# Patient Record
Sex: Male | Born: 1995 | Race: Black or African American | Hispanic: No | Marital: Single | Smoking: Never smoker
Health system: Southern US, Community
[De-identification: ages and names within clinical notes are randomized; demographics above are authoritative.]

## PROBLEM LIST (undated history)

## (undated) DIAGNOSIS — E669 Obesity, unspecified: Secondary | ICD-10-CM

## (undated) HISTORY — DX: Obesity, unspecified: E66.9

---
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
## 2016-06-25 ENCOUNTER — Emergency Department (HOSPITAL_COMMUNITY)
Admission: EM | Admit: 2016-06-25 | Discharge: 2016-06-26 | Disposition: A | Discharge status: Home / Self Care | Payer: Medicaid Other | Attending: Emergency Medicine | Admitting: Emergency Medicine

## 2016-06-25 ENCOUNTER — Other Ambulatory Visit: Payer: Self-pay

## 2016-06-25 ENCOUNTER — Encounter (HOSPITAL_COMMUNITY): Payer: Self-pay

## 2016-06-25 DIAGNOSIS — Y999 Unspecified external cause status: Secondary | ICD-10-CM | POA: Insufficient documentation

## 2016-06-25 DIAGNOSIS — K089 Disorder of teeth and supporting structures, unspecified: Secondary | ICD-10-CM | POA: Insufficient documentation

## 2016-06-25 DIAGNOSIS — R55 Syncope and collapse: Secondary | ICD-10-CM | POA: Diagnosis not present

## 2016-06-25 DIAGNOSIS — S0993XA Unspecified injury of face, initial encounter: Secondary | ICD-10-CM | POA: Diagnosis present

## 2016-06-25 DIAGNOSIS — Y929 Unspecified place or not applicable: Secondary | ICD-10-CM | POA: Insufficient documentation

## 2016-06-25 DIAGNOSIS — E860 Dehydration: Secondary | ICD-10-CM

## 2016-06-25 DIAGNOSIS — S0181XA Laceration without foreign body of other part of head, initial encounter: Secondary | ICD-10-CM | POA: Diagnosis not present

## 2016-06-25 DIAGNOSIS — Y939 Activity, unspecified: Secondary | ICD-10-CM | POA: Diagnosis not present

## 2016-06-25 DIAGNOSIS — W19XXXA Unspecified fall, initial encounter: Secondary | ICD-10-CM | POA: Insufficient documentation

## 2016-06-25 LAB — CBC WITH DIFFERENTIAL/PLATELET
BASOS ABS: 0 10*3/uL (ref 0.0–0.1)
BASOS PCT: 0 %
EOS ABS: 0.1 10*3/uL (ref 0.0–0.7)
Eosinophils Relative: 2 %
HCT: 46.7 % (ref 39.0–52.0)
HEMOGLOBIN: 15.8 g/dL (ref 13.0–17.0)
Lymphocytes Relative: 21 %
Lymphs Abs: 1.4 10*3/uL (ref 0.7–4.0)
MCH: 31 pg (ref 26.0–34.0)
MCHC: 33.8 g/dL (ref 30.0–36.0)
MCV: 91.6 fL (ref 78.0–100.0)
MONOS PCT: 10 %
Monocytes Absolute: 0.6 10*3/uL (ref 0.1–1.0)
NEUTROS PCT: 67 %
Neutro Abs: 4.4 10*3/uL (ref 1.7–7.7)
Platelets: 207 10*3/uL (ref 150–400)
RBC: 5.1 MIL/uL (ref 4.22–5.81)
RDW: 12.7 % (ref 11.5–15.5)
WBC: 6.6 10*3/uL (ref 4.0–10.5)

## 2016-06-25 LAB — BASIC METABOLIC PANEL
ANION GAP: 9 (ref 5–15)
BUN: 9 mg/dL (ref 6–20)
CHLORIDE: 106 mmol/L (ref 101–111)
CO2: 24 mmol/L (ref 22–32)
CREATININE: 1.31 mg/dL — AB (ref 0.61–1.24)
Calcium: 8.7 mg/dL — ABNORMAL LOW (ref 8.9–10.3)
GFR calc Af Amer: >60 mL/min (ref >60)
GFR calc non Af Amer: >60 mL/min (ref >60)
Glucose, Bld: 91 mg/dL (ref 65–99)
Potassium: 3.9 mmol/L (ref 3.5–5.1)
SODIUM: 139 mmol/L (ref 135–145)

## 2016-06-25 MED ORDER — SODIUM CHLORIDE 0.9 % IV SOLN
Freq: Once | INTRAVENOUS | Status: AC
  Administered 2016-06-25: 23:00:00 via INTRAVENOUS

## 2016-06-25 MED ORDER — SODIUM CHLORIDE 0.9 % IV BOLUS (SEPSIS)
1000.0000 mL | Freq: Once | INTRAVENOUS | Status: AC
Start: 2016-06-25 — End: 2016-06-25
  Administered 2016-06-25: 1000 mL via INTRAVENOUS

## 2016-06-25 NOTE — ED Notes (Signed)
Patient given sprite, tolerating well.

## 2016-06-25 NOTE — ED Triage Notes (Signed)
Per EMS, pt at bus stop and was witnessed passing out, pt fell to knees and fell face forward and hit chin on concrete. Pt has deep laceration on chin, bleeding controlled. Pt had another syncopal episode with medic on scene with position change from supine to sitting up. Palpated BP was 80 systolic during episode. Pt gave plasma earlier today. Pt has had multiple syncopal episodes in the past but has never been seen for it. CBG 103, BP 130/72 after 500 ml bolus of NS. RR 16. Spo2 99% on RA. HR 88 NSR. Pt denies allergies, medical hx or taking medication.

## 2016-06-26 ENCOUNTER — Encounter (HOSPITAL_COMMUNITY): Payer: Self-pay | Admitting: Radiology

## 2016-06-26 ENCOUNTER — Emergency Department (HOSPITAL_COMMUNITY): Payer: Medicaid Other

## 2016-06-26 MED ORDER — HYDROCODONE-ACETAMINOPHEN 5-325 MG PO TABS: 2.0000 | ORAL_TABLET | ORAL | 0 refills | Status: DC | PRN

## 2016-06-26 MED ORDER — AMOXICILLIN 500 MG PO CAPS: 500.0000 mg | ORAL_CAPSULE | Freq: Three times a day (TID) | ORAL | 0 refills | Status: DC

## 2016-06-26 MED ORDER — SODIUM CHLORIDE 0.9 % IV BOLUS (SEPSIS)
1000.0000 mL | Freq: Once | INTRAVENOUS | Status: AC
  Administered 2016-06-26: 1000 mL via INTRAVENOUS

## 2016-06-26 MED ORDER — HYDROCODONE-ACETAMINOPHEN 5-325 MG PO TABS
2.0000 | ORAL_TABLET | Freq: Once | ORAL | Status: AC
  Administered 2016-06-26: 2 via ORAL
  Filled 2016-06-26: qty 2

## 2016-06-26 NOTE — ED Notes (Signed)
Pt verbalized understanding of d/c instructions and has no further questions. Pt stable and NAD. Pt ambulated down to the end of the hall and back independently without dizziness.

## 2016-06-26 NOTE — ED Notes (Signed)
Pt called out saying his "teeth were falling out". Entering the room the Pt is seen holding broken pieces of his tooth in his hand. Placed the chipped teeth in a container for the Pt.

## 2016-06-26 NOTE — ED Notes (Signed)
PA-C applied dermabond to lac under patient's chin.

## 2016-06-26 NOTE — ED Notes (Signed)
Provided patient with turkey sandwich and sprite. 

## 2016-06-26 NOTE — ED Provider Notes (Signed)
[facility] DEPT Provider Note   CSN: [id number] Arrival date & time: 06/25/16  2154  First Provider Contact:  First MD Initiated Contact with Patient 06/25/16 2215        History   Chief Complaint Chief Complaint  Patient presents with  . Loss of Consciousness    HPI Jeremy Kennedy is a 20 y.o. male.  The history is provided by the patient. No language interpreter was used.  Loss of Consciousness   This is a new problem. The current episode started less than 1 hour ago. The problem occurs rarely. The problem has been resolved. He lost consciousness for a period of less than one minute. Associated with: Pt gave plasma today. Associated symptoms include light-headedness and weakness. He has tried nothing for the symptoms. His past medical history does not include CAD, CVA, DM, HTN, seizures, TIA or vertigo.    Past Medical History:  Diagnosis Date  . Obesity     Patient Active Problem List   Diagnosis Date Noted  . Vitamin D deficiency 06/13/2015  . BMI (body mass index), pediatric, greater than or equal to 95% for age 34/06/2015  . Elevated blood pressure 06/07/2015    History reviewed. No pertinent surgical history.     Home Medications    Prior to Admission medications   Not on File    Family History Family History  Problem Relation Age of Onset  . Obesity Mother   . Obesity Sister   . Cancer Maternal Grandmother   . Diabetes Maternal Grandfather   . Kidney disease Maternal Grandfather   . Obesity      Social History Social History  Substance Use Topics  . Smoking status: Never Smoker  . Smokeless tobacco: Never Used  . Alcohol use No     Allergies   Review of patient's allergies indicates no known allergies.   Review of Systems Review of Systems  Cardiovascular: Positive for syncope.  Neurological: Positive for weakness and light-headedness.  All other systems reviewed and are negative.    Physical Exam Updated Vital Signs BP 126/56    Pulse 79   Temp 99 F (37.2 C) (Oral)   Resp 20   Ht 5' 11.75" (1.822 m)   SpO2 99%   Physical Exam  Constitutional: He is oriented to person, place, and time. He appears well-developed and well-nourished.  HENT:  Head: Normocephalic.  2cm abrasion/laceration chin  Multiple broken teeth,  Tender bilat jaws.    Eyes: EOM are normal.  Neck: Normal range of motion.  Cardiovascular: Normal rate and regular rhythm.   Pulmonary/Chest: Effort normal.  Abdominal: Soft. Bowel sounds are normal. He exhibits no distension.  Musculoskeletal: Normal range of motion.  Neurological: He is alert and oriented to person, place, and time.  Skin: Skin is warm. Capillary refill takes less than 2 seconds.  Psychiatric: He has a normal mood and affect.  Nursing note and vitals reviewed.    ED Treatments / Results  Labs (all labs ordered are listed, but only abnormal results are displayed) Labs Reviewed  BASIC METABOLIC PANEL - Abnormal; Notable for the following:       Result Value   Creatinine, Ser 1.31 (*)    Calcium 8.7 (*)    All other components within normal limits  CBC WITH DIFFERENTIAL/PLATELET    EKG  EKG Interpretation  Date/Time:  Wednesday June 25 2016 21:22:58 EDT Ventricular Rate:  84 PR Interval:  134 QRS Duration: 98 QT Interval:  374 QTC Calculation:  441 R Axis:   64 Text Interpretation:  Normal sinus rhythm Normal ECG No previous ECGs available Confirmed by Bebe Shaggy  MD, DONALD ([id number]) on 06/25/2016 9:55:45 PM       Radiology Ct Head Wo Contrast  Result Date: 06/26/2016 CLINICAL DATA:  Syncopal episode, now with bruising involving the chin. EXAM: CT HEAD WITHOUT CONTRAST CT MAXILLOFACIAL WITHOUT CONTRAST TECHNIQUE: Multidetector CT imaging of the head and maxillofacial structures were performed using the standard protocol without intravenous contrast. Multiplanar CT image reconstructions of the maxillofacial structures were also generated. COMPARISON:  None.  FINDINGS: CT HEAD FINDINGS Soft tissues are normal. No radiopaque foreign body. No displaced calvarial fracture. Gray-white differentiation is maintained. No CT evidence of acute large territory infarct. No intraparenchymal or extra-axial mass or hemorrhage. Normal size and configuration of the ventricles and the basilar cisterns. No midline shift. There is underpneumatization of the left frontal sinus. The remaining paranasal sinuses mastoid air cells are normally aerated. No air-fluid levels. CT MAXILLOFACIAL FINDINGS No displaced facial fracture. Normal appearance of the nasal bone. Normal appearance of the pterygoid plates. Normal appearance of the zygomatic arches. Normal appearance of the mandible. The bilateral mandibular condyles are normally located. There is reversal of the expected cervical lordosis with mild kyphosis centered about the C5-C6 articulation. No anterolisthesis. Cervical vertebral body heights are preserved. Prevertebral soft tissues are normal. Normal atlantodental and atlantoaxial articulations. Normal noncontrast appearance of the bilateral orbits and globes. No retrobulbar hematoma. Normal appearance of the bilateral lamina papyracea. There is underpneumatization of the left frontal sinus. The remaining paranasal sinuses and mastoid air cells are normally aerated. No air-fluid levels. There is approximately 3 mm of right word nasal septal deviation. Suspected minimal amount of soft tissue stranding about the chin (sagittal image 68, series 10). No radiopaque foreign body. A minimal amount of debris is noted within the bilateral external auditory canals. No bulky cervical lymphadenopathy on this noncontrast examination. Visualized portions of the thyroid gland are normal. IMPRESSION: 1. Negative noncontrast head CT. 2. Suspected minimal amount of soft tissue swelling about the chin without associated radiopaque foreign body or displaced facial fracture. Electronically Signed   By: Simonne Come M.D.   On: 06/26/2016 01:58  Ct Maxillofacial Wo Cm  Result Date: 06/26/2016 CLINICAL DATA:  Syncopal episode, now with bruising involving the chin. EXAM: CT HEAD WITHOUT CONTRAST CT MAXILLOFACIAL WITHOUT CONTRAST TECHNIQUE: Multidetector CT imaging of the head and maxillofacial structures were performed using the standard protocol without intravenous contrast. Multiplanar CT image reconstructions of the maxillofacial structures were also generated. COMPARISON:  None. FINDINGS: CT HEAD FINDINGS Soft tissues are normal. No radiopaque foreign body. No displaced calvarial fracture. Gray-white differentiation is maintained. No CT evidence of acute large territory infarct. No intraparenchymal or extra-axial mass or hemorrhage. Normal size and configuration of the ventricles and the basilar cisterns. No midline shift. There is underpneumatization of the left frontal sinus. The remaining paranasal sinuses mastoid air cells are normally aerated. No air-fluid levels. CT MAXILLOFACIAL FINDINGS No displaced facial fracture. Normal appearance of the nasal bone. Normal appearance of the pterygoid plates. Normal appearance of the zygomatic arches. Normal appearance of the mandible. The bilateral mandibular condyles are normally located. There is reversal of the expected cervical lordosis with mild kyphosis centered about the C5-C6 articulation. No anterolisthesis. Cervical vertebral body heights are preserved. Prevertebral soft tissues are normal. Normal atlantodental and atlantoaxial articulations. Normal noncontrast appearance of the bilateral orbits and globes. No retrobulbar hematoma. Normal appearance of the bilateral  lamina papyracea. There is underpneumatization of the left frontal sinus. The remaining paranasal sinuses and mastoid air cells are normally aerated. No air-fluid levels. There is approximately 3 mm of right word nasal septal deviation. Suspected minimal amount of soft tissue stranding about the chin  (sagittal image 68, series 10). No radiopaque foreign body. A minimal amount of debris is noted within the bilateral external auditory canals. No bulky cervical lymphadenopathy on this noncontrast examination. Visualized portions of the thyroid gland are normal. IMPRESSION: 1. Negative noncontrast head CT. 2. Suspected minimal amount of soft tissue swelling about the chin without associated radiopaque foreign body or displaced facial fracture. Electronically Signed   By: Simonne Come M.D.   On: 06/26/2016 01:58   Procedures .Marland KitchenLaceration Repair Date/Time: 06/26/2016 2:14 AM Performed by: Elson Areas Authorized by: Elson Areas   Consent:    Consent obtained:  Verbal   Consent given by:  Patient   Risks discussed:  Pain   Alternatives discussed:  No treatment Anesthesia (see MAR for exact dosages):    Anesthesia method:  None Laceration details:    Length (cm):  2 Treatment:    Area cleansed with:  Betadine   Amount of cleaning:  Standard   Irrigation solution:  Sterile saline Skin repair:    Repair method:  Tissue adhesive Approximation:    Approximation:  Close Post-procedure details:    Dressing:  Non-adherent dressing   (including critical care time)  Medications Ordered in ED Medications  sodium chloride 0.9 % bolus 1,000 mL (0 mLs Intravenous Stopped 06/25/16 2313)  0.9 %  sodium chloride infusion ( Intravenous Stopped 06/26/16 0008)  sodium chloride 0.9 % bolus 1,000 mL (1,000 mLs Intravenous New Bag/Given 06/26/16 0022)  HYDROcodone-acetaminophen (NORCO/VICODIN) 5-325 MG per tablet 2 tablet (2 tablets Oral Given 06/26/16 0158)     Initial Impression / Assessment and Plan / ED Course  I have reviewed the triage vital signs and the nursing notes.  Pertinent labs & imaging results that were available during my care of the patient were reviewed by me and considered in my medical decision making (see chart for details).  Clinical Course  Value Comment By Time  CT HEAD  WO CONTRAST (Reviewed) Elson Areas, PA-C 07/27 0206    Pt given iv fluids x 2 liters.   Pt advised he needs to see dentist for broken teeth.  Referral to Dr. Lucky Cowboy.  RX for amoxicillian and Hydrocodone for pain.   Pt has had multiple syncopal episodes after donating plasma.  Pt advised to rest and drink plenty of fluids.  Pt advised he may need to refrain from plasma donation.  Final Clinical Impressions(s) / ED Diagnoses   Final diagnoses:  Syncope and collapse  Dehydration  Laceration of skin of chin, initial encounter  Dental injury, initial encounter    New Prescriptions New Prescriptions   AMOXICILLIN (AMOXIL) 500 MG CAPSULE    Take 1 capsule (500 mg total) by mouth 3 (three) times daily.   HYDROCODONE-ACETAMINOPHEN (NORCO/VICODIN) 5-325 MG TABLET    Take 2 tablets by mouth every 4 (four) hours as needed.  An After Visit Summary was printed and given to the patient.   Lonia Skinner Lovelady, PA-C 06/26/16 0225    Zadie Rhine, MD 06/26/16 [phone]

## 2016-06-26 NOTE — ED Notes (Signed)
PA-C at bedside 

## 2016-10-12 ENCOUNTER — Encounter (HOSPITAL_COMMUNITY): Payer: Self-pay

## 2016-10-12 ENCOUNTER — Emergency Department (HOSPITAL_COMMUNITY)
Admission: EM | Admit: 2016-10-12 | Discharge: 2016-10-12 | Disposition: A | Discharge status: Home / Self Care | Payer: Medicaid Other | Attending: Emergency Medicine | Admitting: Emergency Medicine

## 2016-10-12 ENCOUNTER — Emergency Department (HOSPITAL_COMMUNITY): Payer: Medicaid Other

## 2016-10-12 DIAGNOSIS — E860 Dehydration: Secondary | ICD-10-CM | POA: Insufficient documentation

## 2016-10-12 DIAGNOSIS — R739 Hyperglycemia, unspecified: Secondary | ICD-10-CM

## 2016-10-12 DIAGNOSIS — M25572 Pain in left ankle and joints of left foot: Secondary | ICD-10-CM | POA: Diagnosis not present

## 2016-10-12 DIAGNOSIS — N289 Disorder of kidney and ureter, unspecified: Secondary | ICD-10-CM

## 2016-10-12 DIAGNOSIS — R55 Syncope and collapse: Secondary | ICD-10-CM

## 2016-10-12 DIAGNOSIS — R52 Pain, unspecified: Secondary | ICD-10-CM

## 2016-10-12 LAB — I-STAT CHEM 8, ED
BUN: 10 mg/dL (ref 6–20)
Calcium, Ion: 1.12 mmol/L — ABNORMAL LOW (ref 1.15–1.40)
Chloride: 101 mmol/L (ref 101–111)
Creatinine, Ser: 1.3 mg/dL — ABNORMAL HIGH (ref 0.61–1.24)
Glucose, Bld: 122 mg/dL — ABNORMAL HIGH (ref 65–99)
HEMATOCRIT: 44 % (ref 39.0–52.0)
HEMOGLOBIN: 15 g/dL (ref 13.0–17.0)
POTASSIUM: 3.9 mmol/L (ref 3.5–5.1)
SODIUM: 141 mmol/L (ref 135–145)
TCO2: 25 mmol/L (ref 0–100)

## 2016-10-12 MED ORDER — SODIUM CHLORIDE 0.9 % IV BOLUS (SEPSIS)
1000.0000 mL | Freq: Once | INTRAVENOUS | Status: AC
  Administered 2016-10-12: 1000 mL via INTRAVENOUS

## 2016-10-12 NOTE — Discharge Instructions (Signed)
Read the information below.  You may return to the Emergency Department at any time for worsening condition or any new symptoms that concern you.    Please drink plenty of fluids and consider spacing out your plasma donation more in the future.  Please see a family doctor or primary care provider to recheck your blood sugar and your kidney function.

## 2016-10-12 NOTE — ED Provider Notes (Signed)
[facility] Provider Note   CSN: [id number] Arrival date & time: 10/12/16  1824     History   Chief Complaint Chief Complaint  Patient presents with  . Loss of Consciousness    HPI Jeremy Kennedy is a 20 y.o. male.  HPI   Pt with hx morbid obesity p/w syncopal episode and left ankle pain.  States he gave plasma today, also two days ago, was standing up on the city bus on his way home and felt lightheaded followed by syncopal episode.  Larey SeatFell and hurt his left ankle.  Denies any other injury.  Denies CP, SOB, cough, recent vomiting or diarrhea.  Any other sick symptoms.      Past Medical History:  Diagnosis Date  . Obesity     Patient Active Problem List   Diagnosis Date Noted  . Vitamin D deficiency 06/13/2015  . BMI (body mass index), pediatric, greater than or equal to 95% for age 53/06/2015  . Elevated blood pressure 06/07/2015    History reviewed. No pertinent surgical history.     Home Medications    Prior to Admission medications   Medication Sig Start Date End Date Taking? Authorizing Provider  amoxicillin (AMOXIL) 500 MG capsule Take 1 capsule (500 mg total) by mouth 3 (three) times daily. 06/26/16   Elson AreasLeslie K Sofia, PA-C  HYDROcodone-acetaminophen (NORCO/VICODIN) 5-325 MG tablet Take 2 tablets by mouth every 4 (four) hours as needed. 06/26/16   Elson AreasLeslie K Sofia, PA-C    Family History Family History  Problem Relation Age of Onset  . Obesity Mother   . Obesity Sister   . Cancer Maternal Grandmother   . Diabetes Maternal Grandfather   . Kidney disease Maternal Grandfather   . Obesity      Social History Social History  Substance Use Topics  . Smoking status: Never Smoker  . Smokeless tobacco: Never Used  . Alcohol use No     Allergies   Patient has no known allergies.   Review of Systems Review of Systems  All other systems reviewed and are negative.    Physical Exam Updated Vital Signs BP 117/60 (BP Location: Left Arm)   Pulse 81    Temp 98.2 F (36.8 C) (Oral)   Resp 13   SpO2 99%   Physical Exam  Constitutional: He appears well-developed and well-nourished. No distress.  HENT:  Head: Normocephalic and atraumatic.  Neck: Neck supple.  Cardiovascular: Normal rate and regular rhythm.   Pulmonary/Chest: Effort normal and breath sounds normal. No respiratory distress. He has no wheezes. He has no rales.  Abdominal: Soft. He exhibits no distension and no mass. There is no tenderness. There is no rebound and no guarding.  Musculoskeletal:  Left lateral ankle with mild tenderness over malleolus.    Neurological: He is alert. He exhibits normal muscle tone.  Skin: He is not diaphoretic.  Nursing note and vitals reviewed.    ED Treatments / Results  Labs (all labs ordered are listed, but only abnormal results are displayed) Labs Reviewed  I-STAT CHEM 8, ED - Abnormal; Notable for the following:       Result Value   Creatinine, Ser 1.30 (*)    Glucose, Bld 122 (*)    Calcium, Ion 1.12 (*)    All other components within normal limits    EKG  EKG Interpretation  Date/Time:  Sunday October 12 2016 18:29:41 EST Ventricular Rate:  82 PR Interval:    QRS Duration: 103 QT Interval:  373  QTC Calculation: 436 R Axis:   -3 Text Interpretation:  Normal sinus rhythm Normal ECG no significant change since July 2017 Confirmed by Criss AlvineGOLDSTON MD, SCOTT [phone]([id number]) on 10/12/2016 6:34:06 PM       Radiology Dg Ankle Complete Left  Result Date: 10/12/2016 CLINICAL DATA:  Lateral ankle pain status post fall. EXAM: LEFT ANKLE COMPLETE - 3+ VIEW COMPARISON:  None. FINDINGS: There is no evidence of fracture, or joint effusion. The alignment of ankle mortise appears normal on the frontal view, however possible misalignment of the tibial plafond is noted on the lateral view. IMPRESSION: No evidence of fracture. Apparent misalignment of the tibial plafond seen on the lateral view, which may be positional. If patient's symptoms  continue, repeated lateral radiograph may be considered. Electronically Signed   By: Ted Mcalpineobrinka  Dimitrova M.D.   On: 10/12/2016 19:56    Procedures Procedures (including critical care time)  Medications Ordered in ED Medications  sodium chloride 0.9 % bolus 1,000 mL (1,000 mLs Intravenous New Bag/Given 10/12/16 1915)     Initial Impression / Assessment and Plan / ED Course  I have reviewed the triage vital signs and the nursing notes.  Pertinent labs & imaging results that were available during my care of the patient were reviewed by me and considered in my medical decision making (see chart for details).  Clinical Course as of Oct 13 2023  Wynelle LinkSun Oct 12, 2016  2012 Pt is able to ambulate without significant pain and without feeling lightheaded or weak.    [EW]    Clinical Course User Index [EW] Trixie DredgeEmily Gerrad Welker, PA-C   Afebrile, nontoxic patient with syncopal episode after giving plasma for second time in 3 days.  Became tachycardic with standing for orthostatics.  IVF hydration given.  EKG reassuring.  Left ankle injured in fall, able to bear weight easily without significant pain.  ASO placed in ED.  Pt advised to follow up with PCP regarding ankle, hyperglycemia, and renal insufficiency.   D/C home with PCP resources, ASO.   Discussed result, findings, treatment, and follow up  with patient.  Pt given return precautions.  Pt verbalizes understanding and agrees with plan.       Final Clinical Impressions(s) / ED Diagnoses   Final diagnoses:  Pain  Syncope, unspecified syncope type  Dehydration  Acute left ankle pain  Hyperglycemia  Renal insufficiency    New Prescriptions New Prescriptions   No medications on file     Trixie Dredgemily Rosario Kushner, PA-C 10/12/16 2024    Pricilla LovelessScott Goldston, MD 10/12/16 2201

## 2016-10-12 NOTE — Progress Notes (Signed)
Orthopedic Tech Progress Note Patient Details:  Jeremy FiddlerDemoris Kennedy 09/20/1996 [id number]  Ortho Devices Type of Ortho Device: ASO Ortho Device/Splint Location: LLE Ortho Device/Splint Interventions: Ordered, Application   Jennye MoccasinHughes, Toyoko Silos Craig 10/12/2016, 8:28 PM

## 2016-10-12 NOTE — ED Notes (Signed)
Cab called for pt by me. Pt  In cab now

## 2016-10-12 NOTE — ED Triage Notes (Signed)
Per EMS: Pt gave plasma today. Pt on way home, experienced a syncopal episode. Pt denies any head, neck or back pain. Pt complaining of some L ankle pain. CBG = 121 Pt a/o x 4 upon arrival.

## 2016-10-12 NOTE — ED Notes (Signed)
Ortho tech paged  

## 2016-10-12 NOTE — ED Notes (Signed)
Pt eating food he brought with him and drinking sprite. Tolerating well

## 2016-10-13 ENCOUNTER — Other Ambulatory Visit: Payer: Self-pay | Admitting: Pediatrics

## 2018-03-19 IMAGING — CT CT HEAD W/O CM
3 of 7 series · 14 of 47 positions shown, 16 images · non-contrast
Comparison: None.

CLINICAL DATA: Syncopal episode, now with bruising involving the
chin.

EXAM:
CT HEAD WITHOUT CONTRAST
CT MAXILLOFACIAL WITHOUT CONTRAST
TECHNIQUE: Multidetector CT imaging of the head and maxillofacial structures
were performed using the standard protocol without intravenous
contrast. Multiplanar CT image reconstructions of the maxillofacial
structures were also generated.

[Series 6: facialbone 2.0 st · axial · 0.41mm/px · z∈[-268,-112]mm · 8 of 94 slices shown, 10 images]
[im 8/94  brain]
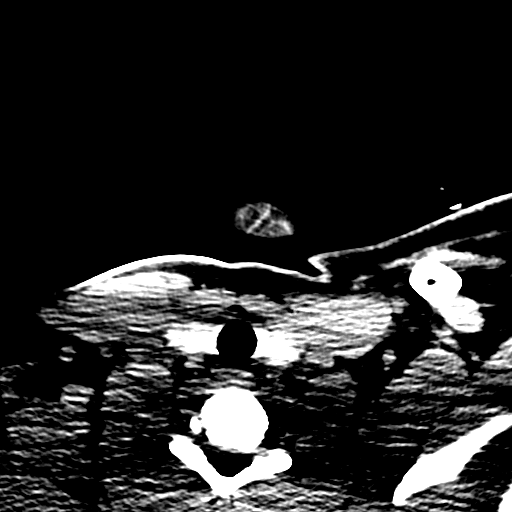
[im 8/94  bone]
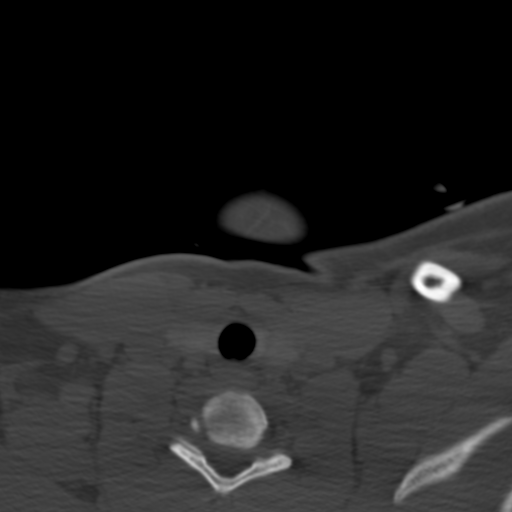
[im 22/94  brain]
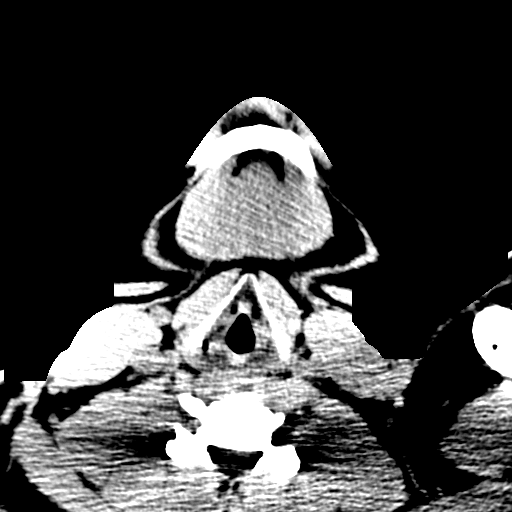
[im 29/94  brain]
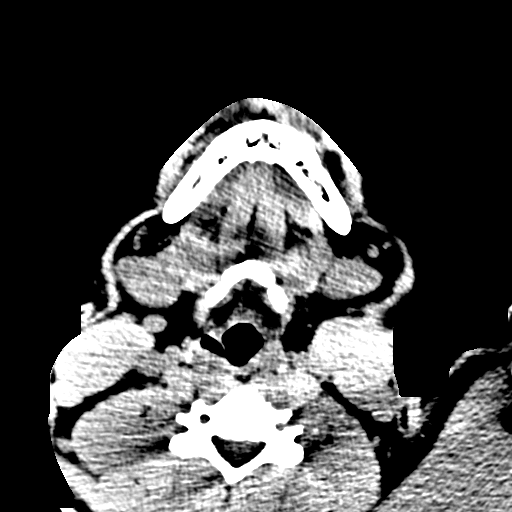
[im 43/94  brain]
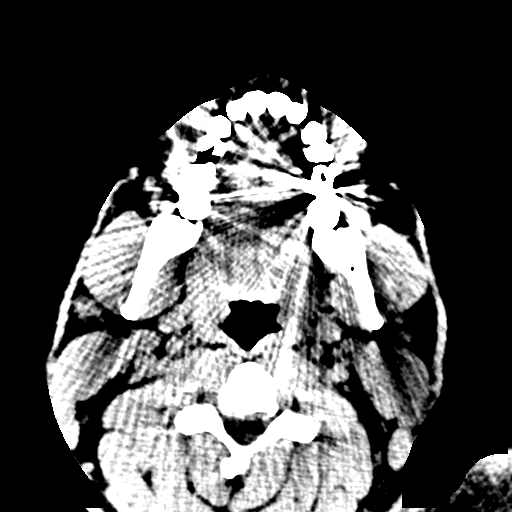
[im 51/94  brain]
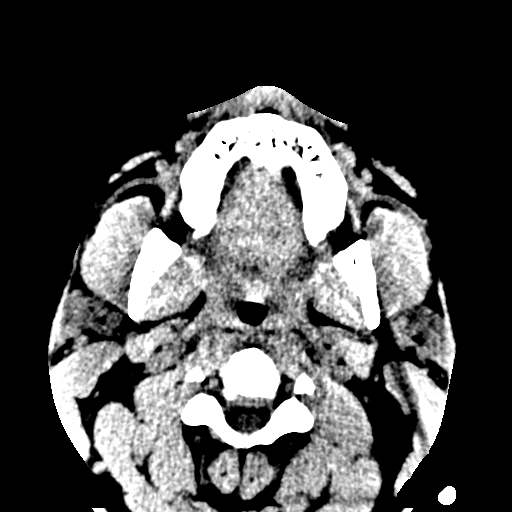
[im 51/94  bone]
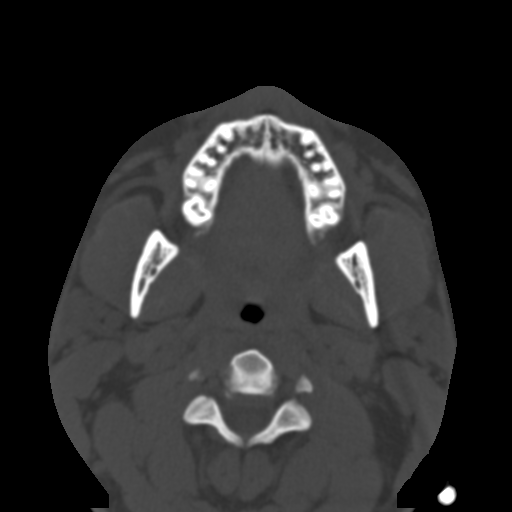
[im 65/94  brain]
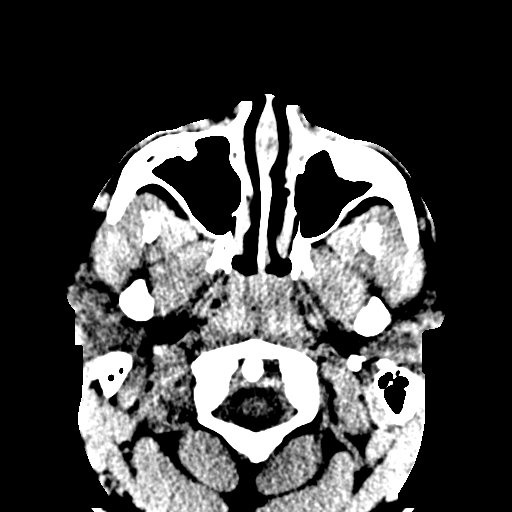
[im 72/94  brain]
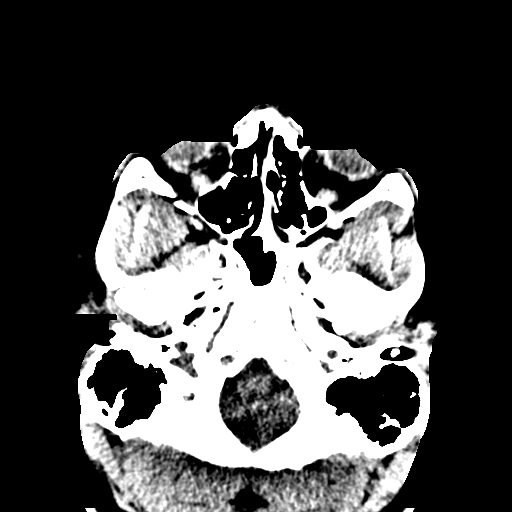
[im 86/94  brain]
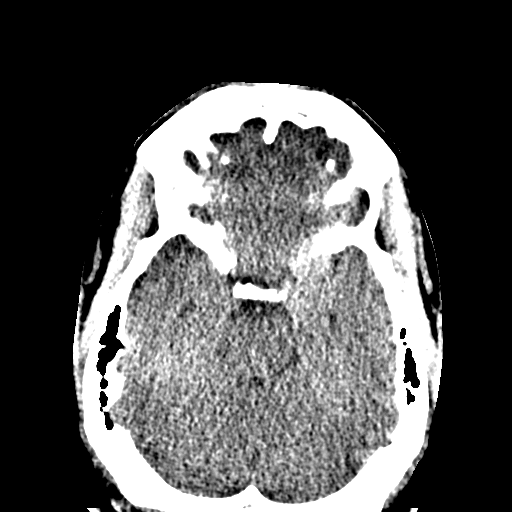

[Series 10: facialbone 2.0 sag st · sagittal · 0.37mm/px · 3 of 123 slices shown]
[im 31/123  brain]
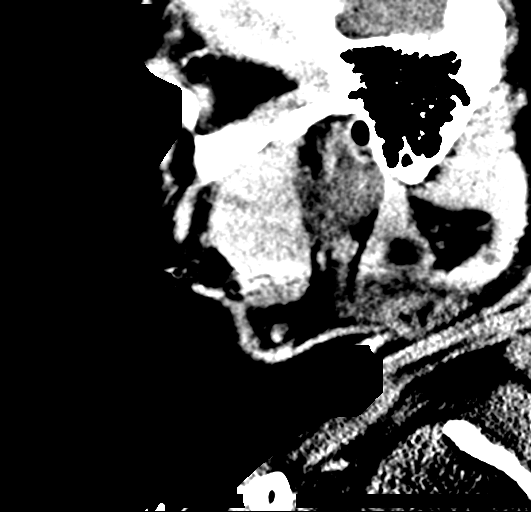
[im 62/123  brain]
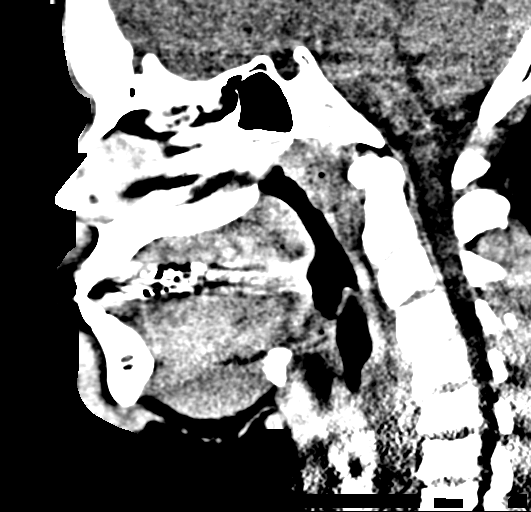
[im 92/123  brain]
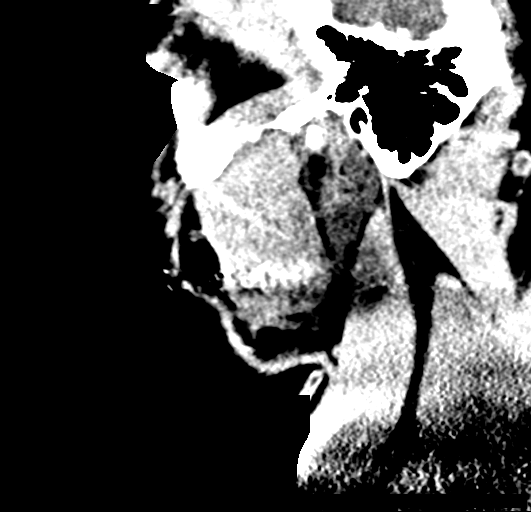

[Series 11: facialbone 2.0 cor st · coronal · 0.37mm/px · 3 of 98 slices shown]
[im 28/98  brain]
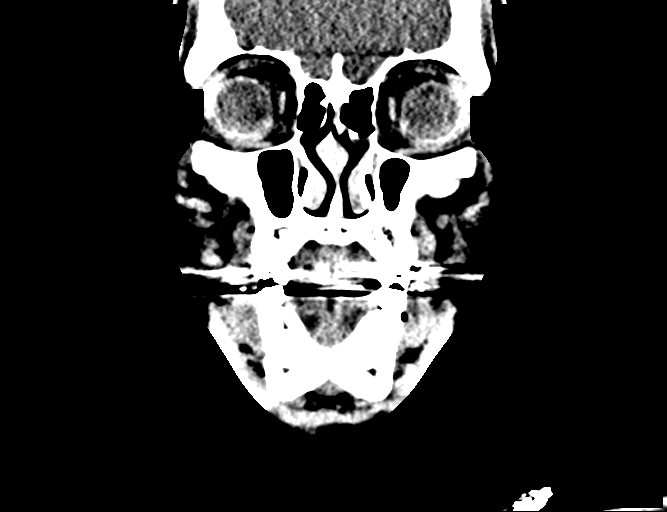
[im 42/98  brain]
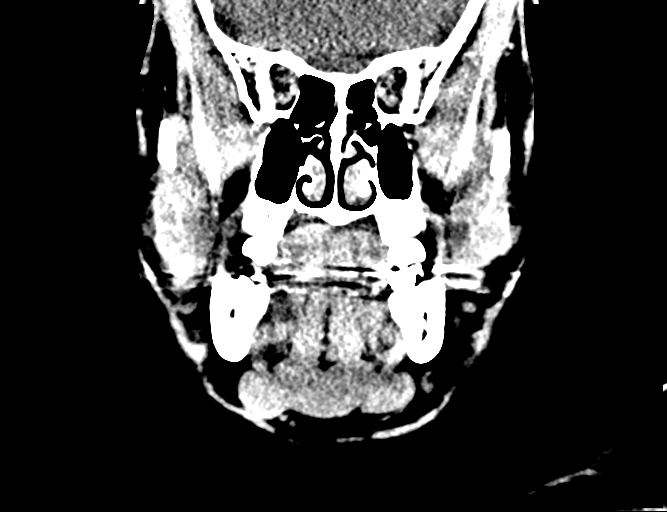
[im 56/98  brain]
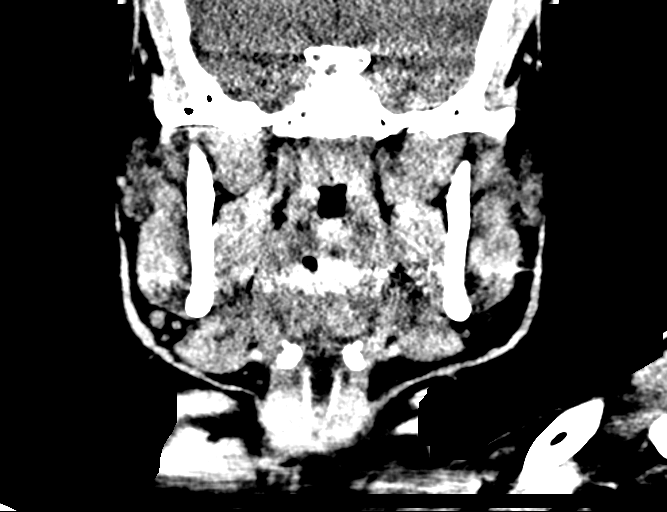

[14 of 47 positions shown; findings below may reference images not displayed]

FINDINGS: CT HEAD FINDINGS

Soft tissues are normal. No radiopaque foreign body. No displaced
calvarial fracture.

Gray-white differentiation is maintained. No CT evidence of acute
large territory infarct. No intraparenchymal or extra-axial mass or
hemorrhage. Normal size and configuration of the ventricles and the
basilar cisterns. No midline shift.

There is underpneumatization of the left frontal sinus. The
remaining paranasal sinuses mastoid air cells are normally aerated.
No air-fluid levels.

CT MAXILLOFACIAL FINDINGS

No displaced facial fracture.

Normal appearance of the nasal bone. Normal appearance of the
pterygoid plates. Normal appearance of the zygomatic arches. Normal
appearance of the mandible. The bilateral mandibular condyles are
normally located.

There is reversal of the expected cervical lordosis with mild
kyphosis centered about the C5-C6 articulation. No anterolisthesis.
Cervical vertebral body heights are preserved. Prevertebral soft
tissues are normal. Normal atlantodental and atlantoaxial
articulations.

Normal noncontrast appearance of the bilateral orbits and globes. No
retrobulbar hematoma. Normal appearance of the bilateral lamina
papyracea.

There is underpneumatization of the left frontal sinus. The
remaining paranasal sinuses and mastoid air cells are normally
aerated. No air-fluid levels. There is approximately 3 mm of right
word nasal septal deviation.

Suspected minimal amount of soft tissue stranding about the chin
(sagittal image 68, series 10). No radiopaque foreign body.

A minimal amount of debris is noted within the bilateral external
auditory canals. No bulky cervical lymphadenopathy on this
noncontrast examination. Visualized portions of the thyroid gland
are normal.
IMPRESSION: 1. Negative noncontrast head CT.
2. Suspected minimal amount of soft tissue swelling about the chin
without associated radiopaque foreign body or displaced facial
fracture.

## 2018-07-05 IMAGING — DX DG ANKLE COMPLETE 3+V*L*
3 series · 3 of 3 positions shown · non-contrast
Comparison: None.

CLINICAL DATA: Lateral ankle pain status post fall.

EXAM:
LEFT ANKLE COMPLETE - 3+ VIEW

[ankle ap]
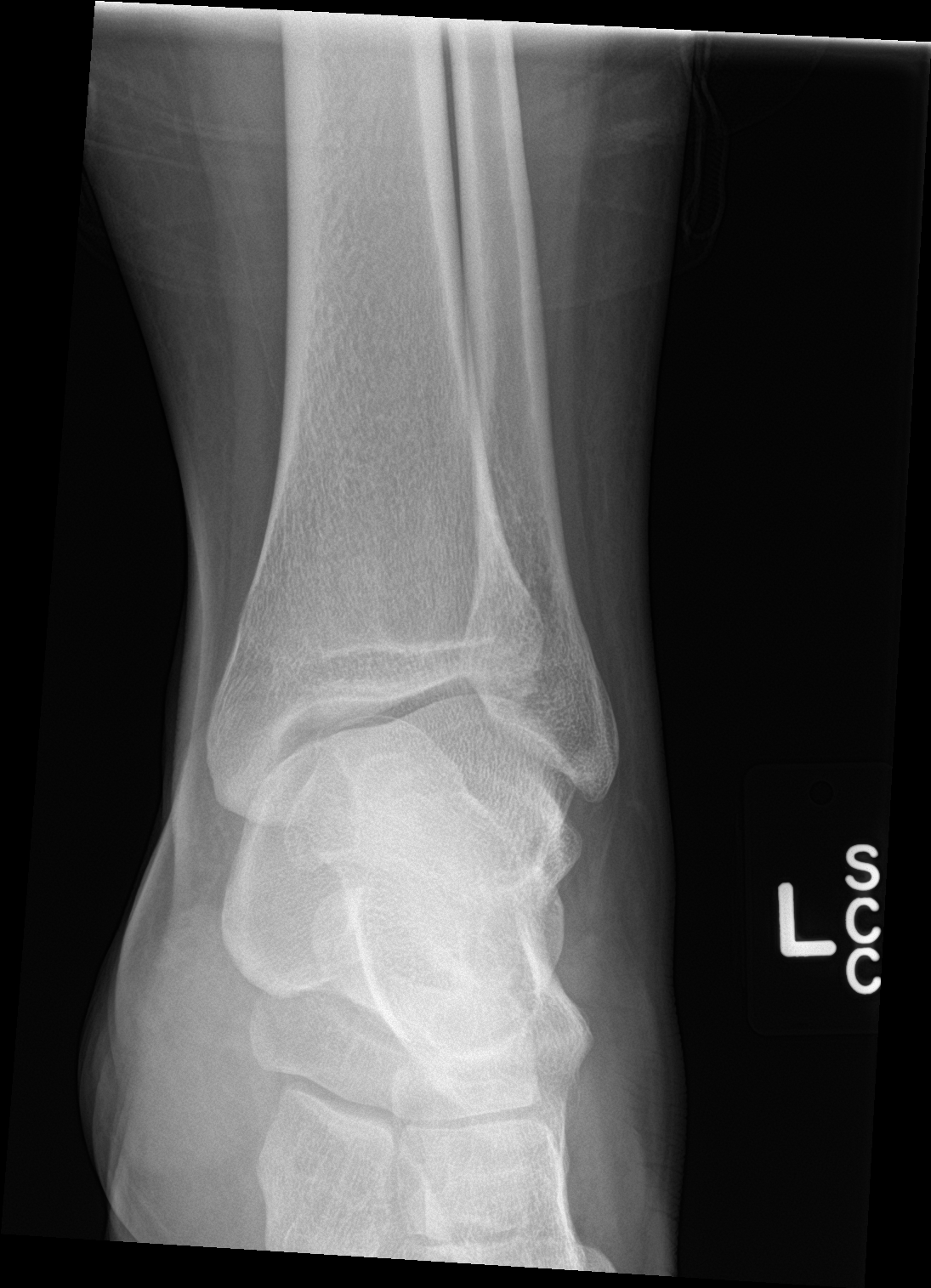

[ankle obl]
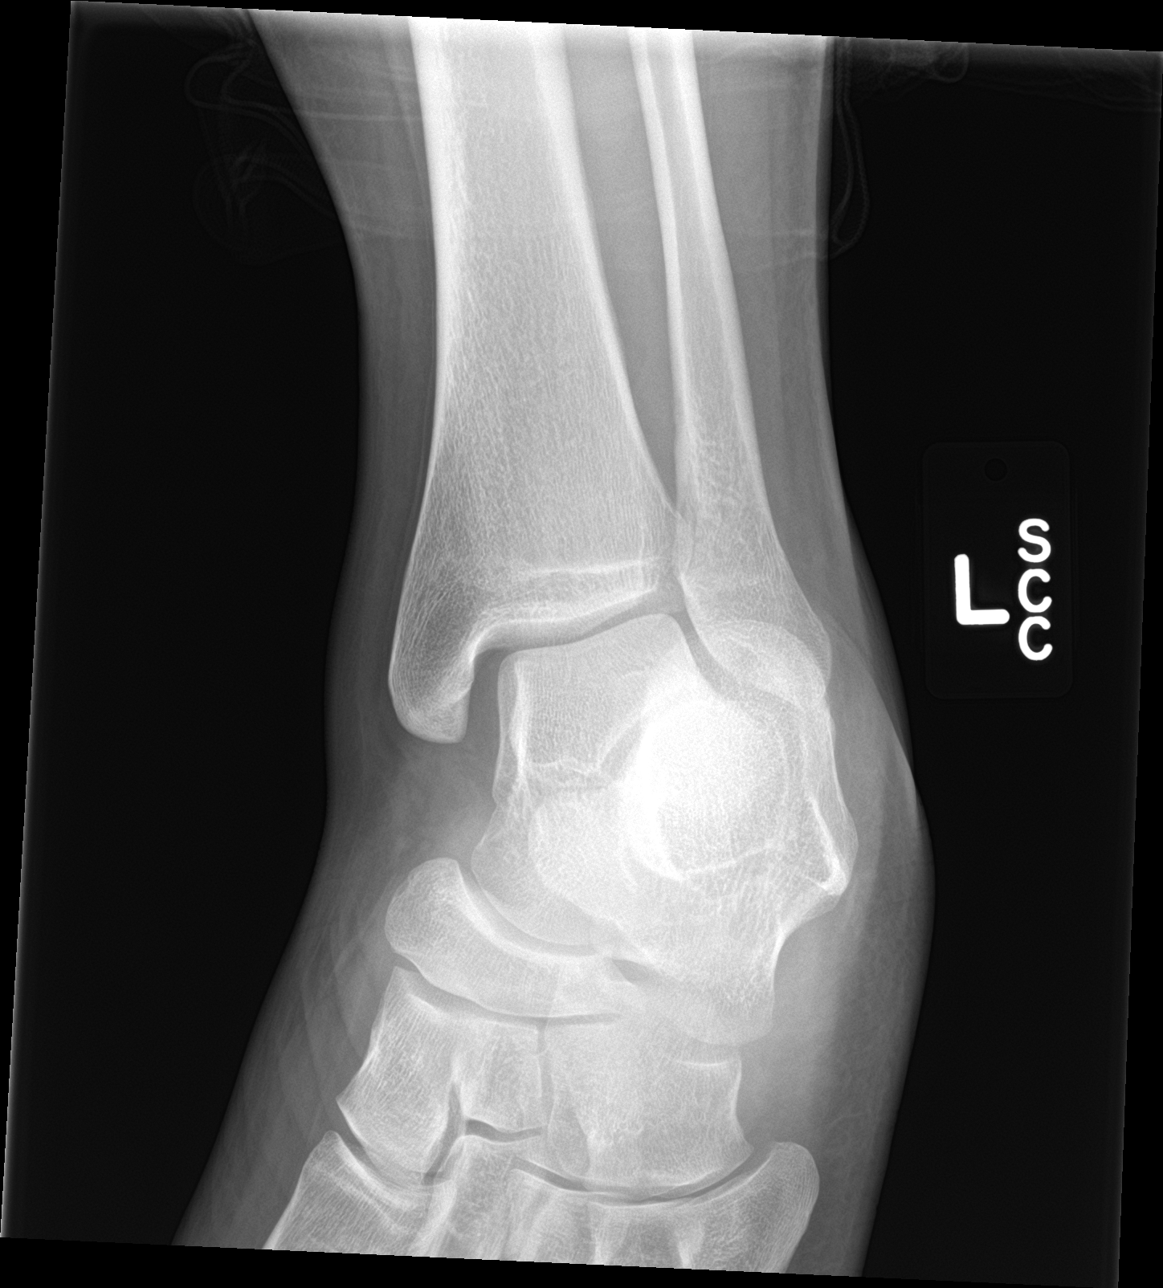

[ankle lat]
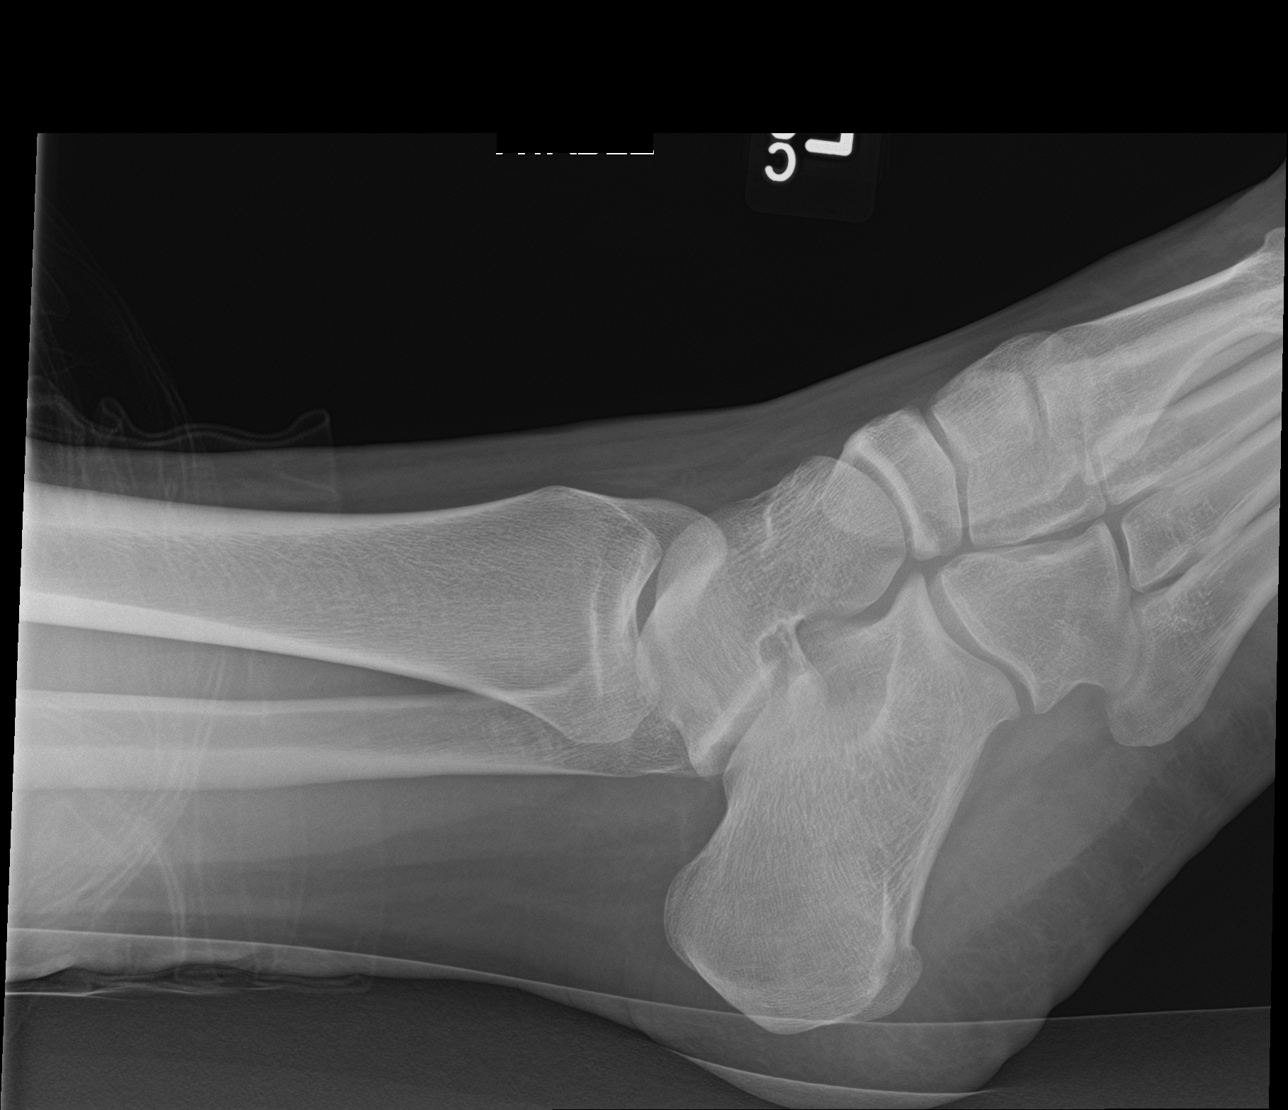

[3 of 3 positions shown; findings below may reference images not displayed]

FINDINGS: There is no evidence of fracture, or joint effusion. The alignment
of ankle mortise appears normal on the frontal view, however
possible misalignment of the tibial plafond is noted on the lateral
view.
IMPRESSION: No evidence of fracture.

Apparent misalignment of the tibial plafond seen on the lateral
view, which may be positional. If patient's symptoms continue,
repeated lateral radiograph may be considered.

## 2020-04-16 ENCOUNTER — Encounter: Payer: Self-pay | Admitting: Pediatrics

## 2022-11-06 DIAGNOSIS — K029 Dental caries, unspecified: Secondary | ICD-10-CM | POA: Diagnosis not present

## 2022-11-07 ENCOUNTER — Encounter: Payer: Self-pay | Admitting: Student

## 2022-11-07 ENCOUNTER — Ambulatory Visit: Payer: Medicaid Other | Admitting: Student

## 2022-11-07 VITALS — BP 122/63 | HR 105 | Ht 71.0 in | Wt >= 6400 oz

## 2022-11-07 DIAGNOSIS — F322 Major depressive disorder, single episode, severe without psychotic features: Secondary | ICD-10-CM | POA: Diagnosis not present

## 2022-11-07 DIAGNOSIS — Z13228 Encounter for screening for other metabolic disorders: Secondary | ICD-10-CM

## 2022-11-07 MED ORDER — ESCITALOPRAM OXALATE 10 MG PO TABS: 10.0000 mg | ORAL_TABLET | Freq: Every day | ORAL | 0 refills | Status: DC

## 2022-11-07 NOTE — Patient Instructions (Addendum)
We will start lexapro 10 mg daily  If you are feeling suicidal or depression symptoms worsen please immediately go to:   If you are thinking about harming yourself or having thoughts of suicide, or if you know someone who is, seek help right away. If you are in crisis, make sure you are not left alone.  If someone else is in crisis, make sure he/she/they is not left alone  Call 988 OR 1-800-273-TALK  24 Hour Availability for Walk-IN services  [facility]  [address] [place], [place] [phone] Crisis [phone]    Other crisis resources:  [facility] [employer] (Domestic Violence, Rape & Victim Assistance [phone]  [facility] [employer] [facility]    (ONLY from 8am-4pm)    [phone]  [facility] (24/7)   [phone]  [place] National Suicide Hotline   [phone] Len Childs)

## 2022-11-07 NOTE — Progress Notes (Unsigned)
   New Patient Office Visit  Subjective    Patient ID: Jeremy Kennedy, male    DOB: [birth year]  Age: 26 y.o. MRN: [id number]  CC:  Chief Complaint  Patient presents with   Establish Care    HPI Jeremy Kennedy presents to establish care  PMH-elevated BMI, elevated BP PSxHx-none Allergies-none, shrimp feels like he has to throw up [facility] month -Ladoris Gene      11/07/2022    3:17 PM  Depression screen PHQ 2/9  Decreased Interest 3  Down, Depressed, Hopeless 3  PHQ - 2 Score 6  Altered sleeping 3  Tired, decreased energy 3  Change in appetite 3  Feeling bad or failure about yourself  3  Trouble concentrating 3  Moving slowly or fidgety/restless 3  Suicidal thoughts 2  PHQ-9 Score 26  Difficult doing work/chores Extremely dIfficult     No outpatient encounter medications on file as of 11/07/2022.   No facility-administered encounter medications on file as of 11/07/2022.    Past Medical History:  Diagnosis Date   Obesity     No past surgical history on file.  Family History  Problem Relation Age of Onset   Obesity Mother    Obesity Sister    Cancer Maternal Grandmother    Diabetes Maternal Grandfather    Kidney disease Maternal Grandfather    Obesity Unknown     Social History   Socioeconomic History   Marital status: Single    Spouse name: Not on file   Number of children: Not on file   Years of education: Not on file   Highest education level: Not on file  Occupational History   Not on file  Tobacco Use   Smoking status: Never   Smokeless tobacco: Never  Substance and Sexual Activity   Alcohol use: No    Alcohol/week: 0.0 standard drinks of alcohol   Drug use: No   Sexual activity: Never  Other Topics Concern   Not on file  Social History Narrative   Lives with Mom, sister and niece   Social Determinants of Health   Financial Resource Strain: Not on file  Food Insecurity: Not on file  Transportation Needs: Not  on file  Physical Activity: Not on file  Stress: Not on file  Social Connections: Not on file  Intimate Partner Violence: Not on file    ROS      Objective    BP 122/63   Pulse (!) 105   Ht 5\' 11"  (1.803 m)   Wt (!) 434 lb 9.6 oz (197.1 kg)   SpO2 98%   BMI 60.61 kg/m   Physical Exam  {Labs (Optional):23779}    Assessment & Plan:   Problem List Items Addressed This Visit   None Visit Diagnoses     Encounter for screening for other metabolic disorders    -  Primary       No follow-ups on file.   , MD

## 2022-11-08 ENCOUNTER — Encounter: Payer: Self-pay | Admitting: Student

## 2022-11-08 DIAGNOSIS — F322 Major depressive disorder, single episode, severe without psychotic features: Secondary | ICD-10-CM | POA: Insufficient documentation

## 2022-11-08 NOTE — Assessment & Plan Note (Signed)
Severe depression that has worsened with a plan (sleeping pills although patient has no access to this). Discussed with attending Dr. Miquel Dunn. Patient was able to safety contract in the room with me. He says if he were to get these thoughts again he would either distract himself with youtube or TikTok or call 988. He does want to try a medication today. He has not tried any SSRI before. He denies any mania symptoms to me in the room. Given severity will start with lexapro 10. Discussed with him this will take some time to work but that we will need close follow up with him. He is agreeable to this. Given all of this-I believe he was safe to return him and did not need immediate transport to [facility].  -AVS resources including 988, [facility] etc -Follow up in 1 weeks with me -Lexapro 10 sent to pharmacy

## 2022-11-11 ENCOUNTER — Ambulatory Visit: Payer: Self-pay | Admitting: Student

## 2022-11-14 ENCOUNTER — Ambulatory Visit: Payer: Medicaid Other | Admitting: Student

## 2022-11-14 ENCOUNTER — Encounter: Payer: Self-pay | Admitting: Student

## 2022-11-14 VITALS — BP 127/80 | HR 92 | Ht 71.0 in | Wt >= 6400 oz

## 2022-11-14 DIAGNOSIS — F322 Major depressive disorder, single episode, severe without psychotic features: Secondary | ICD-10-CM | POA: Diagnosis not present

## 2022-11-14 DIAGNOSIS — Z23 Encounter for immunization: Secondary | ICD-10-CM

## 2022-11-14 DIAGNOSIS — Z13228 Encounter for screening for other metabolic disorders: Secondary | ICD-10-CM | POA: Diagnosis not present

## 2022-11-14 NOTE — Progress Notes (Signed)
    SUBJECTIVE:   CHIEF COMPLAINT / HPI: Depression follow up  Seen on 11/07/2022 for depression with thoughts of SI. Started on lexapro 10 mg daily. Suicidal thoughts more along lines of feeling depressed per patient.feels like he is feeling somewhat better.  Mom was in the room initially and she has noticed a difference in [place] and an improvement.  When questioned alone patient stated that thoughts of SI have gone down greatly and only had a thought 1 time.  He denies any sort of plan at that time and it was a passing thought.  He discloses to me that he is scheduling a follow-up with his therapist and looking forward to this.     11/14/2022    2:26 PM 11/07/2022    3:17 PM  Depression screen PHQ 2/9  Decreased Interest 3 3  Down, Depressed, Hopeless 3 3  PHQ - 2 Score 6 6  Altered sleeping 3 3  Tired, decreased energy 3 3  Change in appetite 3 3  Feeling bad or failure about yourself  3 3  Trouble concentrating 3 3  Moving slowly or fidgety/restless 3 3  Suicidal thoughts 1 2  PHQ-9 Score 25 26  Difficult doing work/chores Extremely dIfficult Extremely dIfficult    Obesity Hx of prediabetes Patient would like lab testing today.  PERTINENT  PMH / PSH: obesity  OBJECTIVE:   BP 127/80   Pulse 92   Ht 5\' 11"  (1.803 m)   Wt (!) 429 lb (194.6 kg)   SpO2 99%   BMI 59.83 kg/m   General: NAD, awake, alert, responsive to questions Head: Normocephalic atraumatic Respiratory: chest rises symmetrically,  no increased work of breathing  ASSESSMENT/PLAN:   Depression, major, single episode, severe (HCC) Patient appears to be improved from last visit.  Still has very high PHQ-9 at 25 but seems to feel that the Lexapro has helped somewhat already.  I congratulated patient for taking initiative of his mental health and getting in touch with his therapist again.  He would like to follow-up with our clinic more closely as he feels like it is helpful to come here to stay  accountable. -Continue Lexapro 10 mg -Follow-up in 1 to 2 weeks, discussed stopping at front desk to set up appointment -AVS SI resources provided  Obesity, Class III, BMI 40-49.9 (morbid obesity) (HCC) BMI in office today 59.83.  Has had history of prediabetes. -Obtain CBC, BMP, A1c -Discussed options with patient at future visits   , MD [facility] Medicine [facility]

## 2022-11-14 NOTE — Patient Instructions (Addendum)
It was great to see you! Thank you for allowing me to participate in your care!   Our plans for today:  - We will get your flu shot today - We will get your labs today  - Continue your medicines and therapy! - stop at front desk and set up an appointment for 1-2 weeks  Take care and seek immediate care sooner if you develop any concerns.  Levin Erp, MD    If you are feeling suicidal or depression symptoms worsen please immediately go to:   If you are thinking about harming yourself or having thoughts of suicide, or if you know someone who is, seek help right away. If you are in crisis, make sure you are not left alone.  If someone else is in crisis, make sure he/she/they is not left alone  Call 988 OR 1-800-273-TALK  24 Hour Availability for Walk-IN services  [facility]  [address] [place], [place] [employer] [phone] Crisis [phone]    Other crisis resources:  [facility] [employer] (Domestic Violence, Rape & Victim Assistance [phone]  [facility]    (ONLY from 8am-4pm)    [phone]  [facility] Mobile [facility] Unit (24/7)   [phone]  [place] National Suicide Hotline   [phone] Len Childs)

## 2022-11-15 LAB — BASIC METABOLIC PANEL
BUN/Creatinine Ratio: 8 — ABNORMAL LOW (ref 9–20)
BUN: 9 mg/dL (ref 6–20)
CO2: 23 mmol/L (ref 20–29)
Calcium: 9.3 mg/dL (ref 8.7–10.2)
Chloride: 105 mmol/L (ref 96–106)
Creatinine, Ser: 1.18 mg/dL (ref 0.76–1.27)
Glucose: 100 mg/dL — ABNORMAL HIGH (ref 70–99)
Potassium: 3.7 mmol/L (ref 3.5–5.2)
Sodium: 142 mmol/L (ref 134–144)
eGFR: 88 mL/min/{1.73_m2} (ref >59)

## 2022-11-15 LAB — CBC
Hematocrit: 41.7 % (ref 37.5–51.0)
Hemoglobin: 13.9 g/dL (ref 13.0–17.7)
MCH: 30 pg (ref 26.6–33.0)
MCHC: 33.3 g/dL (ref 31.5–35.7)
MCV: 90 fL (ref 79–97)
Platelets: 288 10*3/uL (ref 150–450)
RBC: 4.64 x10E6/uL (ref 4.14–5.80)
RDW: 13.5 % (ref 11.6–15.4)
WBC: 8.1 10*3/uL (ref 3.4–10.8)

## 2022-11-15 NOTE — Assessment & Plan Note (Addendum)
Patient appears to be improved from last visit.  Still has very high PHQ-9 at 25 but seems to feel that the Lexapro has helped somewhat already.  I congratulated patient for taking initiative of his mental health and getting in touch with his therapist again.  He would like to follow-up with our clinic more closely as he feels like it is helpful to come here to stay accountable. -Continue Lexapro 10 mg -Follow-up in 1 to 2 weeks, discussed stopping at front desk to set up appointment -AVS SI resources provided

## 2022-11-15 NOTE — Assessment & Plan Note (Addendum)
BMI in office today 59.83.  Has had history of prediabetes. -Obtain CBC, BMP, A1c -Discussed options with patient at future visits

## 2022-11-17 DIAGNOSIS — M9905 Segmental and somatic dysfunction of pelvic region: Secondary | ICD-10-CM | POA: Diagnosis not present

## 2022-11-17 DIAGNOSIS — M5386 Other specified dorsopathies, lumbar region: Secondary | ICD-10-CM | POA: Diagnosis not present

## 2022-11-17 DIAGNOSIS — M9904 Segmental and somatic dysfunction of sacral region: Secondary | ICD-10-CM | POA: Diagnosis not present

## 2022-11-17 DIAGNOSIS — M9903 Segmental and somatic dysfunction of lumbar region: Secondary | ICD-10-CM | POA: Diagnosis not present

## 2022-11-19 DIAGNOSIS — M9905 Segmental and somatic dysfunction of pelvic region: Secondary | ICD-10-CM | POA: Diagnosis not present

## 2022-11-19 DIAGNOSIS — M5386 Other specified dorsopathies, lumbar region: Secondary | ICD-10-CM | POA: Diagnosis not present

## 2022-11-19 DIAGNOSIS — M9904 Segmental and somatic dysfunction of sacral region: Secondary | ICD-10-CM | POA: Diagnosis not present

## 2022-11-19 DIAGNOSIS — M9903 Segmental and somatic dysfunction of lumbar region: Secondary | ICD-10-CM | POA: Diagnosis not present

## 2022-11-21 ENCOUNTER — Ambulatory Visit: Payer: Medicaid Other | Admitting: Student

## 2022-11-21 ENCOUNTER — Encounter: Payer: Self-pay | Admitting: Student

## 2022-11-21 VITALS — BP 124/78 | HR 86 | Ht 71.0 in | Wt >= 6400 oz

## 2022-11-21 DIAGNOSIS — F322 Major depressive disorder, single episode, severe without psychotic features: Secondary | ICD-10-CM | POA: Diagnosis not present

## 2022-11-21 DIAGNOSIS — M792 Neuralgia and neuritis, unspecified: Secondary | ICD-10-CM | POA: Diagnosis not present

## 2022-11-21 DIAGNOSIS — L600 Ingrowing nail: Secondary | ICD-10-CM | POA: Diagnosis not present

## 2022-11-21 NOTE — Patient Instructions (Signed)
It was great to see you! Thank you for allowing me to participate in your care!   Our plans for today:  - Continue your lexapro at the same dosage - You can take tylenol alongside the lexapro - Follow up with me in 2 weeks-stop at the front desk - I have provided the resources below  Take care and seek immediate care sooner if you develop any concerns.  Levin Erp, MD    If you are feeling suicidal or depression symptoms worsen please immediately go to:   If you are thinking about harming yourself or having thoughts of suicide, or if you know someone who is, seek help right away. If you are in crisis, make sure you are not left alone.  If someone else is in crisis, make sure he/she/they is not left alone  Call 988 OR 1-800-273-TALK  24 Hour Availability for Walk-IN services  [facility]  [address] [place], [place] [facility] [phone] Crisis [phone]    Other crisis resources:  [facility] [employer] (Domestic Violence, Rape & Victim Assistance [phone]  [facility] [employer] [facility]    (ONLY from 8am-4pm)    [phone]  [facility] (24/7)   [phone]  [place] National Suicide Hotline   [phone] Len Childs)

## 2022-11-21 NOTE — Assessment & Plan Note (Addendum)
Patient seems to be stable although not improving much on Lexapro that was just started.  Discussed with him that he should not see any improvement until around 4 to 6 weeks.  We discussed at that time we can consider going up on his dosage.  Although patient does not see any difference I do see difference from visit to visit of him being more talkative and more fast responding than previously.  He is having some issues with sleep and I discussed to take his Lexapro earlier in the morning that of at 11 AM. -Continue Lexapro 10 mg daily -Follow-up with psychiatrist and therapist her patient -Follow-up with me in 2 weeks -AVS SI resources provided

## 2022-11-21 NOTE — Progress Notes (Signed)
    SUBJECTIVE:   CHIEF COMPLAINT / HPI: Follow up for depression  Here for follow-up of depression.  Was started on Lexapro and on 12/8.  It is now been the start of 2 weeks on medication.  He currently feels the same as last week.  He has follow-up scheduled for a psychiatrist first and then is going to follow-up with his therapist.  Has not thought of any SI and denies any plans.  He says only issues that he has been having his sometimes the Lexapro wakes him up early.  Where he is getting up at 1 AM - only getting about 4 hours of sleep. Takes it at 11 AM.  Denies any racing thoughts or periods of time where he gets no sleep, denies any mania symptoms. Earlier today got to ingrown toenails removed.  He wants to know if he may take Tylenol rapid relief with this.    11/21/2022    2:11 PM 11/14/2022    2:26 PM 11/07/2022    3:17 PM  Depression screen PHQ 2/9  Decreased Interest 3 3 3   Down, Depressed, Hopeless 3 3 3   PHQ - 2 Score 6 6 6   Altered sleeping 3 3 3   Tired, decreased energy 3 3 3   Change in appetite 3 3 3   Feeling bad or failure about yourself  3 3 3   Trouble concentrating 3 3 3   Moving slowly or fidgety/restless  3 3  Suicidal thoughts 1 1 2   PHQ-9 Score 22 25 26   Difficult doing work/chores Extremely dIfficult Extremely dIfficult Extremely dIfficult    PERTINENT  PMH / PSH:   OBJECTIVE:   BP 124/78   Pulse 86   Ht 5\' 11"  (1.803 m)   Wt (!) 423 lb (191.9 kg)   SpO2 98%   BMI 59.00 kg/m   General: Well appearing, NAD, awake, alert, responsive to questions Head: Normocephalic atraumatic Respiratory: chest rises symmetrically,  no increased work of breathing Extremities: Moves upper and lower extremities freely Neuro: No focal deficits  ASSESSMENT/PLAN:   Depression, major, single episode, severe (HCC) Patient seems to be stable although not improving much on Lexapro that was just started.  Discussed with him that he should not see any improvement until around  4 to 6 weeks.  We discussed at that time we can consider going up on his dosage.  Although patient does not see any difference I do see difference from visit to visit of him being more talkative and more fast responding than previously.  He is having some issues with sleep and I discussed to take his Lexapro earlier in the morning that of at 11 AM. -Continue Lexapro 10 mg daily -Follow-up with psychiatrist and therapist her patient -Follow-up with me in 2 weeks -AVS SI resources provided    , MD [facility]

## 2022-11-26 DIAGNOSIS — M9905 Segmental and somatic dysfunction of pelvic region: Secondary | ICD-10-CM | POA: Diagnosis not present

## 2022-11-26 DIAGNOSIS — M9903 Segmental and somatic dysfunction of lumbar region: Secondary | ICD-10-CM | POA: Diagnosis not present

## 2022-11-26 DIAGNOSIS — M5386 Other specified dorsopathies, lumbar region: Secondary | ICD-10-CM | POA: Diagnosis not present

## 2022-11-26 DIAGNOSIS — M9904 Segmental and somatic dysfunction of sacral region: Secondary | ICD-10-CM | POA: Diagnosis not present

## 2022-12-05 DIAGNOSIS — L600 Ingrowing nail: Secondary | ICD-10-CM | POA: Diagnosis not present

## 2022-12-05 DIAGNOSIS — M792 Neuralgia and neuritis, unspecified: Secondary | ICD-10-CM | POA: Diagnosis not present

## 2022-12-08 DIAGNOSIS — M9904 Segmental and somatic dysfunction of sacral region: Secondary | ICD-10-CM | POA: Diagnosis not present

## 2022-12-08 DIAGNOSIS — M9903 Segmental and somatic dysfunction of lumbar region: Secondary | ICD-10-CM | POA: Diagnosis not present

## 2022-12-08 DIAGNOSIS — M5386 Other specified dorsopathies, lumbar region: Secondary | ICD-10-CM | POA: Diagnosis not present

## 2022-12-08 DIAGNOSIS — M9905 Segmental and somatic dysfunction of pelvic region: Secondary | ICD-10-CM | POA: Diagnosis not present

## 2022-12-15 ENCOUNTER — Ambulatory Visit: Payer: Medicaid Other | Admitting: Family Medicine

## 2022-12-15 ENCOUNTER — Ambulatory Visit: Payer: Self-pay | Admitting: Student

## 2022-12-15 DIAGNOSIS — F322 Major depressive disorder, single episode, severe without psychotic features: Secondary | ICD-10-CM | POA: Diagnosis not present

## 2022-12-15 MED ORDER — ESCITALOPRAM OXALATE 10 MG PO TABS: 20.0000 mg | ORAL_TABLET | Freq: Every day | ORAL | 1 refills | Status: DC

## 2022-12-15 NOTE — Patient Instructions (Addendum)
It was great seeing you today!  Today we discussed your mood, we will increase the lexapro to a higher dose at 20 mg. Please take 2 tablets of the 10 mg tablets.   Please follow up at your next scheduled appointment on 1/29 at 2:50 pm with Dr. Jinny Sanders, if anything arises between now and then, please don't hesitate to contact our office.   Thank you for allowing [place] to be a part of your medical care!  Thank you, Dr. Larae Grooms  Also a reminder of our clinic's no-show policy. Please make sure to arrive at least 15 minutes prior to your scheduled appointment time. Please try to cancel before 24 hours if you are not able to make it. If you no-show for 2 appointments then you will be receiving a warning letter. If you no-show after 3 visits, then you may be at risk of being dismissed from our clinic. This is to ensure that everyone is able to be seen in a timely manner. Thank you, we appreciate your assistance with this!

## 2022-12-15 NOTE — Progress Notes (Signed)
    SUBJECTIVE:   CHIEF COMPLAINT / HPI:   Patient presents for mood check, was seen by PCP recently and started on lexapro. Today he reports doing the same since his last visit. He has run out of his lexapro a week ago. But on the medication he reports that he was still feeling the same according to his mood. Reports compliance with medication other than over the past week when he ran out. Mom has noticed significant positive changes, mentally he is still keeps to himself but not as much as he was doing before. He says that he has not noticed a difference in his mood since he has not thought about it too much. Mom is pleased with his progress. Support system includes his mother and sister. Denies any current suicidal ideations, he last endorsed thoughts of harming himself a week after starting the lexapro which he confirms is around 12/15. He denies having any suicidal ideations since his last visit. Last saw the therapist 2 months ago who recommended psychiatry, appointment with psychiatrist is coming up but mother and patient are both unsure of the exact date.   Patient prefers mother remain in the room throughout the encounter when asked.   OBJECTIVE:   BP 126/74   Pulse 91   Wt (!) 424 lb 12.8 oz (192.7 kg)   SpO2 97%   BMI 59.25 kg/m   General: Patient well-appearing, in no acute distress. Resp: normal work of breathing noted Psych: slightly decreased mood at times but smiling through parts of the encounter, denies SI or plan    ASSESSMENT/PLAN:   Depression, major, single episode, severe (HCC) -PHQ-9 score of 25 with 2 for question 9 reviewed and discussed extensively, after clarifying it seems that reassuringly patient is not exhibiting any passive or active SI and has not done so for the past month -great to see that patient has exhibited improvement that is noticeable by others, congratulated patient on his progression and reassurance provided -after much discussion, patient is  agreeable to continue lexapro and wanting to increase dose which I believe is appropriate at this time, lexapro 20 mg started -instructed to follow up with psychiatrist which is scheduled for soon -techniques discussed including to continue to utilize support system -follow up in 2 weeks for mood check and to monitor progression scheduled for 1/29   Donney Dice, [place]

## 2022-12-15 NOTE — Assessment & Plan Note (Addendum)
-  PHQ-9 score of 25 with 2 for question 9 reviewed and discussed extensively, after clarifying it seems that reassuringly patient is not exhibiting any passive or active SI and has not done so for the past month -great to see that patient has exhibited improvement that is noticeable by others, congratulated patient on his progression and reassurance provided -after much discussion, patient is agreeable to continue lexapro and wanting to increase dose which I believe is appropriate at this time, lexapro 20 mg started -instructed to follow up with psychiatrist which is scheduled for soon -techniques discussed including to continue to utilize support system -follow up in 2 weeks for mood check and to monitor progression scheduled for 1/29

## 2022-12-15 NOTE — Progress Notes (Deleted)
    SUBJECTIVE:   CHIEF COMPLAINT / HPI:   Started on Lexapro 10 mg daily on 11/07/2022.  Since then has been feeling***.  Has been following with therapist***.  SI***     11/21/2022    2:11 PM 11/14/2022    2:26 PM 11/07/2022    3:17 PM  Depression screen PHQ 2/9  Decreased Interest 3 3 3   Down, Depressed, Hopeless 3 3 3   PHQ - 2 Score 6 6 6   Altered sleeping 3 3 3   Tired, decreased energy 3 3 3   Change in appetite 3 3 3   Feeling bad or failure about yourself  3 3 3   Trouble concentrating 3 3 3   Moving slowly or fidgety/restless  3 3  Suicidal thoughts 1 1 2   PHQ-9 Score 22 25 26   Difficult doing work/chores Extremely dIfficult Extremely dIfficult Extremely dIfficult    PERTINENT  PMH / PSH: ***  OBJECTIVE:   There were no vitals taken for this visit.  ***  ASSESSMENT/PLAN:   No problem-specific Assessment & Plan notes found for this encounter.     Gerrit Heck, MD [place]

## 2022-12-16 ENCOUNTER — Other Ambulatory Visit: Payer: Self-pay

## 2022-12-16 DIAGNOSIS — M5386 Other specified dorsopathies, lumbar region: Secondary | ICD-10-CM | POA: Diagnosis not present

## 2022-12-16 DIAGNOSIS — M9903 Segmental and somatic dysfunction of lumbar region: Secondary | ICD-10-CM | POA: Diagnosis not present

## 2022-12-16 DIAGNOSIS — F322 Major depressive disorder, single episode, severe without psychotic features: Secondary | ICD-10-CM

## 2022-12-16 DIAGNOSIS — M9905 Segmental and somatic dysfunction of pelvic region: Secondary | ICD-10-CM | POA: Diagnosis not present

## 2022-12-16 DIAGNOSIS — M9904 Segmental and somatic dysfunction of sacral region: Secondary | ICD-10-CM | POA: Diagnosis not present

## 2022-12-24 DIAGNOSIS — M9904 Segmental and somatic dysfunction of sacral region: Secondary | ICD-10-CM | POA: Diagnosis not present

## 2022-12-24 DIAGNOSIS — M9903 Segmental and somatic dysfunction of lumbar region: Secondary | ICD-10-CM | POA: Diagnosis not present

## 2022-12-24 DIAGNOSIS — M5386 Other specified dorsopathies, lumbar region: Secondary | ICD-10-CM | POA: Diagnosis not present

## 2022-12-24 DIAGNOSIS — M9905 Segmental and somatic dysfunction of pelvic region: Secondary | ICD-10-CM | POA: Diagnosis not present

## 2022-12-29 ENCOUNTER — Encounter: Payer: Self-pay | Admitting: Student

## 2022-12-29 ENCOUNTER — Ambulatory Visit: Payer: Medicaid Other | Admitting: Student

## 2022-12-29 VITALS — BP 124/69 | HR 78 | Wt >= 6400 oz

## 2022-12-29 DIAGNOSIS — F322 Major depressive disorder, single episode, severe without psychotic features: Secondary | ICD-10-CM | POA: Diagnosis not present

## 2022-12-29 NOTE — Assessment & Plan Note (Signed)
Elevated PHQ-9 today stable from the past.  No passive or active SI. -Continue Lexapro 20 mg daily -Follow up with therapist -SMART goal made today- walking 10 minutes a day at 6 PM -Follow up in 2 weeks

## 2022-12-29 NOTE — Patient Instructions (Addendum)
It was great to see you! Thank you for allowing me to participate in your care!   Our plans for today:  - Continue the 20 mg lexapro - Go to the appointment with therapist - Walk outside 10 minutes a day at 6 PM - 2 week follow up  Take care and seek immediate care sooner if you develop any concerns.  Gerrit Heck, MD

## 2022-12-29 NOTE — Progress Notes (Signed)
    SUBJECTIVE:   CHIEF COMPLAINT / HPI: Depression follow-up  Patient was increased on Lexapro to 20 mg on 1/15.  Since then he has felt similar to the past.  He denies any suicidal thoughts passive or active.  He has not been able to follow-up with therapist but he does have a follow-up scheduled for 2/26 with behavioral health.  Mom is a slight improvement in mood since starting the increased dose of medication.  We discussed that he does most of the day which is play video games for most of the day.     12/29/2022    3:14 PM 12/15/2022    4:20 PM 11/21/2022    2:11 PM 11/14/2022    2:26 PM 11/07/2022    3:17 PM  Depression screen PHQ 2/9  Decreased Interest 3 3 3 3 3   Down, Depressed, Hopeless 3 3 3 3 3   PHQ - 2 Score 6 6 6 6 6   Altered sleeping 3 3 3 3 3   Tired, decreased energy 3 3 3 3 3   Change in appetite 3 2 3 3 3   Feeling bad or failure about yourself  3 3 3 3 3   Trouble concentrating 3 3 3 3 3   Moving slowly or fidgety/restless 3 3  3 3   Suicidal thoughts 0 2 1 1 2   PHQ-9 Score 24 25 22 25 26   Difficult doing work/chores   Extremely dIfficult Extremely dIfficult Extremely dIfficult    PERTINENT  PMH / PSH:   OBJECTIVE:   BP 124/69   Pulse 78   Wt (!) 425 lb 4 oz (192.9 kg)   SpO2 98%   BMI 59.31 kg/m   General: Well appearing, NAD, awake, alert, responsive to questions Head: Normocephalic atraumatic Respiratory: chest rises symmetrically,  no increased work of breathing Extremities: Moves upper and lower extremities freely  ASSESSMENT/PLAN:   Depression, major, single episode, severe (HCC) Elevated PHQ-9 today stable from the past.  No passive or active SI. -Continue Lexapro 20 mg daily -Follow up with therapist -SMART goal made today- walking 10 minutes a day at 6 PM -Follow up in 2 weeks   Gerrit Heck, MD [place]

## 2023-01-20 ENCOUNTER — Encounter: Payer: Self-pay | Admitting: Student

## 2023-01-20 ENCOUNTER — Ambulatory Visit: Payer: Medicaid Other | Admitting: Student

## 2023-01-20 VITALS — BP 124/78 | HR 80 | Wt >= 6400 oz

## 2023-01-20 DIAGNOSIS — F322 Major depressive disorder, single episode, severe without psychotic features: Secondary | ICD-10-CM

## 2023-01-20 MED ORDER — BUPROPION HCL ER (XL) 150 MG PO TB24: 150.0000 mg | ORAL_TABLET | Freq: Every day | ORAL | 0 refills | Status: DC

## 2023-01-20 NOTE — Patient Instructions (Signed)
It was great to see you! Thank you for allowing me to participate in your care!   Our plans for today:  - I have referred you to psychiatry - I have sent in wellbutrin 150 mg daily -Follow up in 1-2 weeks  Take care and seek immediate care sooner if you develop any concerns.  Gerrit Heck, MD

## 2023-01-20 NOTE — Progress Notes (Unsigned)
    SUBJECTIVE:   CHIEF COMPLAINT / HPI: Depression F/u  Increased lexapro to 20 mg on 1/15. Since then has been feeling more tired. We had made a SMART goal at last visit to walk 10 minutes daily outside-he has not been able to do this as it is hard to go outside. 2/26 has apointment with counselor. No SI     01/20/2023    2:58 PM 12/29/2022    3:14 PM 12/15/2022    4:20 PM 11/21/2022    2:11 PM 11/14/2022    2:26 PM  Depression screen PHQ 2/9  Decreased Interest 3 3 3 3 3  $ Down, Depressed, Hopeless 3 3 3 3 3  $ PHQ - 2 Score 6 6 6 6 6  $ Altered sleeping 3 3 3 3 3  $ Tired, decreased energy 3 3 3 3 3  $ Change in appetite 2 3 2 3 3  $ Feeling bad or failure about yourself  3 3 3 3 3  $ Trouble concentrating 2 3 3 3 3  $ Moving slowly or fidgety/restless 2 3 3  3  $ Suicidal thoughts 0 0 2 1 1  $ PHQ-9 Score 21 24 25 22 25  $ Difficult doing work/chores    Extremely dIfficult Extremely dIfficult     PERTINENT  PMH / PSH: obesity  OBJECTIVE:   BP 124/78   Pulse 80   Wt (!) 420 lb (190.5 kg)   SpO2 96%   BMI 58.58 kg/m   General: Well appearing, NAD, awake, alert, responsive to questions Head: Normocephalic atraumatic Respiratory: chest rises symmetrically,  no increased work of breathing Neuro: No focal deficits Skin: No rashes or lesions visualized   ASSESSMENT/PLAN:   Depression, major, single episode, severe ([place]) Discussed options of augmentation, referral to psychiatry and switching of agents with patient. He would like to augment with wellbutrin and have me refer to psychiatry. Discussed side effects of wellbutrin in depth-denies any hx of seizures. -Continue lexapro 20 mg -Wellbutrin 150 mg daily -Psychiatry referral -F/u 1-2 weeks   Gerrit Heck, MD [place]

## 2023-01-22 NOTE — Assessment & Plan Note (Addendum)
Discussed options of augmentation, referral to psychiatry and switching of agents with patient. He would like to augment with wellbutrin and have me refer to psychiatry. Discussed side effects of wellbutrin in depth-denies any hx of seizures. -Continue lexapro 20 mg -Wellbutrin 150 mg daily -Psychiatry referral -F/u 1-2 weeks

## 2023-01-26 ENCOUNTER — Ambulatory Visit (INDEPENDENT_AMBULATORY_CARE_PROVIDER_SITE_OTHER): Payer: Medicaid Other | Admitting: Clinical

## 2023-01-26 ENCOUNTER — Encounter (HOSPITAL_COMMUNITY): Payer: Self-pay

## 2023-01-26 DIAGNOSIS — F332 Major depressive disorder, recurrent severe without psychotic features: Secondary | ICD-10-CM
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
## 2023-03-03 ENCOUNTER — Ambulatory Visit (INDEPENDENT_AMBULATORY_CARE_PROVIDER_SITE_OTHER): Payer: Medicaid Other | Admitting: Physician Assistant

## 2023-03-03 ENCOUNTER — Encounter (HOSPITAL_COMMUNITY): Payer: Self-pay | Admitting: Physician Assistant

## 2023-03-03 DIAGNOSIS — F322 Major depressive disorder, single episode, severe without psychotic features: Secondary | ICD-10-CM

## 2023-03-03 MED ORDER — ESCITALOPRAM OXALATE 10 MG PO TABS: 20.0000 mg | ORAL_TABLET | Freq: Every day | ORAL | 1 refills | Status: DC

## 2023-03-03 MED ORDER — BUPROPION HCL ER (XL) 150 MG PO TB24: 150.0000 mg | ORAL_TABLET | Freq: Every day | ORAL | 1 refills | Status: DC
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
## 2023-04-14 ENCOUNTER — Ambulatory Visit (INDEPENDENT_AMBULATORY_CARE_PROVIDER_SITE_OTHER): Payer: Medicaid Other | Admitting: Physician Assistant

## 2023-04-14 VITALS — BP 115/66 | HR 78 | Temp 98.1°F | Ht 71.0 in | Wt >= 6400 oz

## 2023-04-14 DIAGNOSIS — F322 Major depressive disorder, single episode, severe without psychotic features: Secondary | ICD-10-CM | POA: Diagnosis not present

## 2023-04-14 DIAGNOSIS — F411 Generalized anxiety disorder: Secondary | ICD-10-CM | POA: Insufficient documentation

## 2023-04-14 MED ORDER — SERTRALINE HCL 50 MG PO TABS: 50.0000 mg | ORAL_TABLET | Freq: Every day | ORAL | 1 refills | Status: DC

## 2023-04-14 MED ORDER — BUPROPION HCL ER (XL) 150 MG PO TB24: 150.0000 mg | ORAL_TABLET | Freq: Every day | ORAL | 1 refills | Status: DC

## 2023-04-14 NOTE — Progress Notes (Unsigned)
BH MD/PA/NP OP Progress Note  04/15/2023 8:41 AM [name]  MRN:  [id number]  Chief Complaint:  Chief Complaint  Patient presents with   Follow-up   Medication Management   HPI:   Jeremy Kennedy is a 27 year old, African-American male with a past psychiatric history significant for generalized anxiety disorder and major depressive disorder who presents to [facility], accompanied by his mother Oluwatobiloba Ginnetti, [phone]), for follow-up and medication management.  Patient is currently being managed on the following psychiatric medications:  Lexapro 20 mg daily Bupropion (Wellbutrin XL) 150 mg 24-hour tablet daily  Patient denies any benefit to being on his medication since the last encounter.  Patient reports that his Lexapro makes him have to urinate a lot.  Patient continues to endorse depression and rates his depression at 4 out of 10 with 10 being most severe.  Patient endorses depression 3 days out of the week with symptoms lasting the whole day. Patient denies having any increase in motivation since the last encounter. Patient also endorses anxiety and rates his anxiety an 8 out of 10.  In regards to stressors, patient states that the biggest stressor is him mostly being in his head.  He reports that one of his problems is that he does not care for himself enough.  He reports that it is easy for him to loathe things about himself.  Patient denies being on any other medications for the management of his mental health besides Wellbutrin and Lexapro.  A PHQ-9 screen was performed with the patient scoring a 19.  A GAD-7 screen was also performed with the patient scoring an 8.  Patient is alert and oriented x 4, calm, cooperative, and fully engaged in conversation during the encounter.  Patient endorses perplex mood.  Patient denies suicidal or homicidal ideations.  He further denies auditory or visual hallucinations and does not appear to be responding to  internal/external stimuli.  Patient endorses fair sleep and receives on average 6 hours of sleep per night.  Patient endorses good appetite and eats on average 3-4 meals per day.  Patient denies alcohol consumption, tobacco use, and illicit drug use.  Visit Diagnosis:    ICD-10-CM   1. Generalized anxiety disorder  F41.1 sertraline (ZOLOFT) 50 MG tablet    2. Depression, major, single episode, severe (HCC)  F32.2 buPROPion (WELLBUTRIN XL) 150 MG 24 hr tablet    sertraline (ZOLOFT) 50 MG tablet      Past Psychiatric History:  Patient has been diagnosed with major depression, single episode, severe.  Patient has been placed on Wellbutrin XL 150 mg 24-hour tablet daily and escitalopram 10 mg daily   Patient denies past history of hospitalization due to mental health   Patient denies a past history of suicide attempts but states that he has had multiple thoughts of wanting to harm himself   Patient denies a past history of homicide  Past Medical History:  Past Medical History:  Diagnosis Date   Obesity    History reviewed. No pertinent surgical history.  Family Psychiatric History:  Patient denies family history of psychiatric illness   Family history of suicide attempts: Patient denies Family history of homicide attempts: Patient denies Family history of substance abuse: Patient denies  Family History:  Family History  Problem Relation Age of Onset   Obesity Mother    Obesity Sister    Cancer Maternal Grandmother    Diabetes Maternal Grandfather    Kidney disease Maternal Grandfather  Obesity Unknown     Social History:  Social History   Socioeconomic History   Marital status: Single    Spouse name: Not on file   Number of children: Not on file   Years of education: Not on file   Highest education level: Not on file  Occupational History   Not on file  Tobacco Use   Smoking status: Never    Passive exposure: Never   Smokeless tobacco: Never  Substance and  Sexual Activity   Alcohol use: No    Alcohol/week: 0.0 standard drinks of alcohol   Drug use: No   Sexual activity: Never  Other Topics Concern   Not on file  Social History Narrative   Lives with Mom, sister and niece   Social Determinants of Health   Financial Resource Strain: Not on file  Food Insecurity: Not on file  Transportation Needs: Not on file  Physical Activity: Not on file  Stress: Not on file  Social Connections: Not on file    Allergies:  Allergies  Allergen Reactions   Shellfish Allergy Nausea Only    Metabolic Disorder Labs: Lab Results  Component Value Date   HGBA1C 5.7 (H) 06/07/2015   MPG 117 (H) 06/07/2015   No results found for: "PROLACTIN" Lab Results  Component Value Date   CHOL 170 (H) 06/07/2015   TRIG 215 (H) 06/07/2015   HDL 30 (L) 06/07/2015   CHOLHDL 5.7 06/07/2015   VLDL 43 (H) 06/07/2015   LDLCALC 97 06/07/2015   Lab Results  Component Value Date   TSH 4.964 06/07/2015    Therapeutic Level Labs: No results found for: "LITHIUM" No results found for: "VALPROATE" No results found for: "CBMZ"  Current Medications: Current Outpatient Medications  Medication Sig Dispense Refill   sertraline (ZOLOFT) 50 MG tablet Take 1 tablet (50 mg total) by mouth daily. 30 tablet 1   buPROPion (WELLBUTRIN XL) 150 MG 24 hr tablet Take 1 tablet (150 mg total) by mouth daily. 30 tablet 1   No current facility-administered medications for this visit.     Musculoskeletal: Strength & Muscle Tone: within normal limits Gait & Station: normal Patient leans: N/A  Psychiatric Specialty Exam: Review of Systems  Psychiatric/Behavioral:  Positive for sleep disturbance. Negative for decreased concentration, dysphoric mood, hallucinations, self-injury and suicidal ideas. The patient is nervous/anxious. The patient is not hyperactive.     Blood pressure 115/66, pulse 78, temperature 98.1 F (36.7 C), temperature source Oral, height 5\' 11"  (1.803 m),  weight (!) 404 lb 3.2 oz (183.3 kg), SpO2 97 %.Body mass index is 56.37 kg/m.  General Appearance: Casual  Eye Contact:  Good  Speech:  Clear and Coherent and Normal Rate  Volume:  Normal  Mood:  Anxious and Depressed  Affect:  Appropriate  Thought Process:  Coherent, Goal Directed, and Descriptions of Associations: Intact  Orientation:  Full (Time, Place, and Person)  Thought Content: WDL   Suicidal Thoughts:  No  Homicidal Thoughts:  No  Memory:  Immediate;   Good Recent;   Good Remote;   Fair  Judgement:  Fair  Insight:  Fair  Psychomotor Activity:  Normal  Concentration:  Concentration: Good and Attention Span: Good  Recall:  Good  Fund of Knowledge: Good  Language: Good  Akathisia:  No  Handed:  Right  AIMS (if indicated): not done  Assets:  Communication Skills Desire for Improvement Housing Social Support  ADL's:  Intact  Cognition: WNL  Sleep:  Fair  Screenings: GAD-7    Flowsheet Row Clinical Support from 04/14/2023 in [facility] Office Visit from 03/03/2023 in [facility]  Total GAD-7 Score 8 8      PHQ2-9    Flowsheet Row Clinical Support from 04/14/2023 in [facility] Office Visit from 03/03/2023 in [facility] Office Visit from 01/20/2023 in [facility] Office Visit from 12/29/2022 in [facility] Office Visit from 12/15/2022 in [facility]  PHQ-2 Total Score 5 5 6 6 6   PHQ-9 Total Score 19 21 21 24 25       Flowsheet Row Clinical Support from 04/14/2023 in [facility] Office Visit from 03/03/2023 in [facility] from 01/26/2023 in [facility]  C-SSRS RISK CATEGORY No Risk Low Risk No Risk        Assessment and Plan:   Bernabe Agudelo is a 27 year old, African-American male with a past  psychiatric history significant for generalized anxiety disorder and major depressive disorder who presents to [facility], accompanied by his mother Newell Olander, [phone]), for follow-up and medication management.  Presents today encounter continuing to endorse ongoing depression and anxiety not managed by his current use of Lexapro and Wellbutrin.  Patient denies being on any other antidepressants besides the ones that he is currently taking at this time.  Provider recommended patient stay on Wellbutrin and to discontinue Lexapro.  Instead of Lexapro, provider suggested patient be placed on Zoloft 50 mg daily for the management of his depressive symptoms and anxiety.  Patient was also informed that Zoloft can work in conjunction with Wellbutrin to help improve mood.  Patient was agreeable to recommendation.  Provider informed patient that prior to starting Zoloft, patient to taper off Lexapro.  Provider recommended patient take Lexapro 10 mg for 3 days, followed by Lexapro 5 mg for 3 more days before discontinuing and starting Zoloft.  Patient vocalized understanding.  Patient's medications to be e-prescribed to pharmacy of choice.  Provider allowed for time at the end of the encounter to discuss potential adverse side effects to patient's current medication regimen.  Patient vocalized understanding.  Collaboration of Care: Collaboration of Care: Medication Management AEB provider managing patient's psychiatric medications, Psychiatrist AEB patient being followed by mental health provider at this facility, and Referral or follow-up with counselor/therapist AEB patient to be set up with a licensed clinical social worker at this facility following the conclusion of the encounter  Patient/Guardian was advised Release of Information must be obtained prior to any record release in order to collaborate their care with an outside provider. Patient/Guardian was advised  if they have not already done so to contact the registration department to sign all necessary forms in order for [place] to release information regarding their care.   Consent: Patient/Guardian gives verbal consent for treatment and assignment of benefits for services provided during this visit. Patient/Guardian expressed understanding and agreed to proceed.   1. Depression, major, single episode, severe (HCC)  - buPROPion (WELLBUTRIN XL) 150 MG 24 hr tablet; Take 1 tablet (150 mg total) by mouth daily.  Dispense: 30 tablet; Refill: 1 - sertraline (ZOLOFT) 50 MG tablet; Take 1 tablet (50 mg total) by mouth daily.  Dispense: 30 tablet; Refill: 1  2. Generalized anxiety disorder  - sertraline (ZOLOFT) 50 MG tablet; Take 1 tablet (50 mg total) by mouth  daily.  Dispense: 30 tablet; Refill: 1  Patient to follow-up in 6 weeks Provider spent a total of 24 minutes with the patient/reviewing patient's chart  Meta Hatchet, PA 04/15/2023, 8:41 AM

## 2023-04-15 ENCOUNTER — Encounter (HOSPITAL_COMMUNITY): Payer: Self-pay | Admitting: Physician Assistant

## 2023-05-22 ENCOUNTER — Encounter (HOSPITAL_COMMUNITY): Payer: Self-pay | Admitting: Physician Assistant

## 2023-05-22 ENCOUNTER — Ambulatory Visit (INDEPENDENT_AMBULATORY_CARE_PROVIDER_SITE_OTHER): Payer: Medicaid Other | Admitting: Physician Assistant

## 2023-05-22 DIAGNOSIS — F322 Major depressive disorder, single episode, severe without psychotic features: Secondary | ICD-10-CM

## 2023-05-22 DIAGNOSIS — F411 Generalized anxiety disorder: Secondary | ICD-10-CM

## 2023-05-22 MED ORDER — SERTRALINE HCL 100 MG PO TABS: 100.0000 mg | ORAL_TABLET | Freq: Every day | ORAL | 1 refills | Status: DC

## 2023-05-22 MED ORDER — BUPROPION HCL ER (XL) 150 MG PO TB24: 150.0000 mg | ORAL_TABLET | Freq: Every day | ORAL | 1 refills | Status: DC

## 2023-05-22 NOTE — Progress Notes (Unsigned)
BH MD/PA/NP OP Progress Note  05/22/2023 6:15 PM Jeremy Kennedy  MRN:  [id number]  Chief Complaint:  Chief Complaint  Patient presents with   Follow-up   Medication Management   HPI:   Jeremy Kennedy is a 27 year old, African-American male with a past psychiatric history significant for generalized anxiety disorder and major depressive disorder who presents to [facility] for follow-up and medication management.  Patient is currently being managed on the following psychiatric medications:  Lexapro 20 mg daily Bupropion (Wellbutrin XL) 150 mg 24-hour tablet daily  Patient denies any significant changes in his mood since taking his medications.  He does report feeling a little happier than usual.  Patient continues to endorse depression he rates a 4 out of 10 with 10 being most severe.  Patient endorses depression 6 days out of the week but states that his symptoms do not last the whole day.  Patient endorses the following depressive symptoms: feelings of sadness, lack of motivation, decreased concentration, and hopelessness.  Patient denies decreased energy, feelings of guilt/worthlessness, or irritability.  Alleviating factors to his depression include being distracted by YouTube.  Patient reports that his depressive episodes are constant and denies any exacerbating factors.  Patient denies any anxiety at this time.  A PHQ-9 screen was performed with the patient scoring of 15.  A GAD-7 screen was also performed with the patient scoring a 19.  Patient is alert and oriented x 4, calm, cooperative, and fully engaged in conversation during the encounter.  Patient endorses sad mood.  Patient denies suicidal or homicidal ideations.  He further denies auditory or visual hallucinations and does not appear to be responding to internal/external stimuli.  Patient endorses fair sleep and receives on average 6 hours of sleep each night.  Patient endorses good appetite and eats  on average 2 meals per day.  Patient denies alcohol consumption, tobacco use, and illicit drug use.  Visit Diagnosis:    ICD-10-CM   1. Generalized anxiety disorder  F41.1 sertraline (ZOLOFT) 100 MG tablet    2. Depression, major, single episode, severe (HCC)  F32.2 sertraline (ZOLOFT) 100 MG tablet    buPROPion (WELLBUTRIN XL) 150 MG 24 hr tablet      Past Psychiatric History:  Patient has been diagnosed with major depression, single episode, severe.  Patient has been placed on Wellbutrin XL 150 mg 24-hour tablet daily and escitalopram 10 mg daily   Patient denies past history of hospitalization due to mental health   Patient denies a past history of suicide attempts but states that he has had multiple thoughts of wanting to harm himself   Patient denies a past history of homicide  Past Medical History:  Past Medical History:  Diagnosis Date   Obesity    No past surgical history on file.  Family Psychiatric History:  Patient denies family history of psychiatric illness   Family history of suicide attempts: Patient denies Family history of homicide attempts: Patient denies Family history of substance abuse: Patient denies  Family History:  Family History  Problem Relation Age of Onset   Obesity Mother    Obesity Sister    Cancer Maternal Grandmother    Diabetes Maternal Grandfather    Kidney disease Maternal Grandfather    Obesity Unknown     Social History:  Social History   Socioeconomic History   Marital status: Single    Spouse name: Not on file   Number of children: Not on file   Years  of education: Not on file   Highest education level: Not on file  Occupational History   Not on file  Tobacco Use   Smoking status: Never    Passive exposure: Never   Smokeless tobacco: Never  Substance and Sexual Activity   Alcohol use: No    Alcohol/week: 0.0 standard drinks of alcohol   Drug use: No   Sexual activity: Never  Other Topics Concern   Not on file   Social History Narrative   Lives with Mom, sister and niece   Social Determinants of Health   Financial Resource Strain: Not on file  Food Insecurity: Not on file  Transportation Needs: Not on file  Physical Activity: Not on file  Stress: Not on file  Social Connections: Not on file    Allergies:  Allergies  Allergen Reactions   Shellfish Allergy Nausea Only    Metabolic Disorder Labs: Lab Results  Component Value Date   HGBA1C 5.7 (H) 06/07/2015   MPG 117 (H) 06/07/2015   No results found for: "PROLACTIN" Lab Results  Component Value Date   CHOL 170 (H) 06/07/2015   TRIG 215 (H) 06/07/2015   HDL 30 (L) 06/07/2015   CHOLHDL 5.7 06/07/2015   VLDL 43 (H) 06/07/2015   LDLCALC 97 06/07/2015   Lab Results  Component Value Date   TSH 4.964 06/07/2015    Therapeutic Level Labs: No results found for: "LITHIUM" No results found for: "VALPROATE" No results found for: "CBMZ"  Current Medications: Current Outpatient Medications  Medication Sig Dispense Refill   buPROPion (WELLBUTRIN XL) 150 MG 24 hr tablet Take 1 tablet (150 mg total) by mouth daily. 30 tablet 1   sertraline (ZOLOFT) 100 MG tablet Take 1 tablet (100 mg total) by mouth daily. 30 tablet 1   No current facility-administered medications for this visit.     Musculoskeletal: Strength & Muscle Tone: within normal limits Gait & Station: normal Patient leans: N/A  Psychiatric Specialty Exam: Review of Systems  Psychiatric/Behavioral:  Positive for sleep disturbance. Negative for decreased concentration, dysphoric mood, hallucinations, self-injury and suicidal ideas. The patient is nervous/anxious. The patient is not hyperactive.     Blood pressure 115/77, pulse 72, height 5\' 11"  (1.803 m), weight (!) 390 lb 12.8 oz (177.3 kg), SpO2 100 %.Body mass index is 54.51 kg/m.  General Appearance: Casual  Eye Contact:  Good  Speech:  Clear and Coherent and Normal Rate  Volume:  Normal  Mood:  Anxious and  Depressed  Affect:  Congruent  Thought Process:  Coherent, Goal Directed, and Descriptions of Associations: Intact  Orientation:  Full (Time, Place, and Person)  Thought Content: WDL   Suicidal Thoughts:  No  Homicidal Thoughts:  No  Memory:  Immediate;   Good Recent;   Good Remote;   Fair  Judgement:  Fair  Insight:  Fair  Psychomotor Activity:  Normal  Concentration:  Concentration: Good and Attention Span: Good  Recall:  Good  Fund of Knowledge: Good  Language: Good  Akathisia:  No  Handed:  Right  AIMS (if indicated): not done  Assets:  Communication Skills Desire for Improvement Housing Social Support  ADL's:  Intact  Cognition: WNL  Sleep:  Fair   Screenings: GAD-7    Flowsheet Row Clinical Support from 05/22/2023 in [facility] Clinical Support from 04/14/2023 in [facility] Office Visit from 03/03/2023 in [facility]  Total GAD-7 Score 9 8 8  PHQ2-9    Flowsheet Row Clinical Support from 05/22/2023 in [facility] Clinical Support from 04/14/2023 in [facility] Office Visit from 03/03/2023 in [facility] Office Visit from 01/20/2023 in [facility] Office Visit from 12/29/2022 in [facility]  PHQ-2 Total Score 5 5 5 6 6   PHQ-9 Total Score 15 19 21 21 24       Flowsheet Row Clinical Support from 05/22/2023 in [facility] Clinical Support from 04/14/2023 in [facility] Office Visit from 03/03/2023 in [facility]  C-SSRS RISK CATEGORY Low Risk No Risk Low Risk        Assessment and Plan:   Jeremy Kennedy is a 27 year old, African-American male with a past psychiatric history significant for generalized anxiety disorder and major depressive disorder who presents to [facility] for follow-up and medication management.  Patient continues to endorse ongoing depression even through the use of his medications.  Although patient endorses continuous depression, he does report being a little happier.  Provider recommended increasing patient's Zoloft from 50 mg to 100 mg daily for the management of his depressive symptoms.  Patient was agreeable to recommendation.  Patient's medications to be e-prescribed to pharmacy of choice.  Collaboration of Care: Collaboration of Care: Medication Management AEB provider managing patient's psychiatric medications, Psychiatrist AEB patient being followed by mental health provider at this facility, and Referral or follow-up with counselor/therapist AEB patient to be set up with a licensed clinical social worker at this facility following the conclusion of the encounter  Patient/Guardian was advised Release of Information must be obtained prior to any record release in order to collaborate their care with an outside provider. Patient/Guardian was advised if they have not already done so to contact the registration department to sign all necessary forms in order for [place] to release information regarding their care.   Consent: Patient/Guardian gives verbal consent for treatment and assignment of benefits for services provided during this visit. Patient/Guardian expressed understanding and agreed to proceed.   1. Generalized anxiety disorder  - sertraline (ZOLOFT) 100 MG tablet; Take 1 tablet (100 mg total) by mouth daily.  Dispense: 30 tablet; Refill: 1  2. Depression, major, single episode, severe (HCC)  - sertraline (ZOLOFT) 100 MG tablet; Take 1 tablet (100 mg total) by mouth daily.  Dispense: 30 tablet; Refill: 1 - buPROPion (WELLBUTRIN XL) 150 MG 24 hr tablet; Take 1 tablet (150 mg total) by mouth daily.  Dispense: 30 tablet; Refill: 1  Patient to follow-up in 6 weeks Provider spent a total of 21  minutes with the patient/reviewing patient's chart  Meta Hatchet, PA 05/22/2023, 6:15 PM

## 2023-06-02 ENCOUNTER — Ambulatory Visit (INDEPENDENT_AMBULATORY_CARE_PROVIDER_SITE_OTHER): Payer: Medicaid Other | Admitting: Licensed Clinical Social Worker

## 2023-06-02 DIAGNOSIS — F411 Generalized anxiety disorder: Secondary | ICD-10-CM

## 2023-06-02 DIAGNOSIS — F322 Major depressive disorder, single episode, severe without psychotic features: Secondary | ICD-10-CM | POA: Diagnosis not present

## 2023-06-02 NOTE — Progress Notes (Signed)
Comprehensive Clinical Assessment (CCA) Note  06/02/2023 Jeremy Kennedy [id number]  Chief Complaint:  Chief Complaint  Patient presents with   Depression   Anxiety   Visit Diagnosis: MDD and GAD    Virtual Visit via Video Note  I connected with Jeremy Kennedy on 06/02/23 at  1:00 PM EDT by a video enabled telemedicine application and verified that I am speaking with the correct person using two identifiers.  Location: Patient: [facility]  Provider: Providers Home    I discussed the limitations of evaluation and management by telemedicine and the availability of in person appointments. The patient expressed understanding and agreed to proceed.   Client is a 27 year old male. Client is referred by mother for a  depression and anxiety.   Client states mental health symptoms as evidenced by:     Client denies suicidal and homicidal ideations at this time Client denies hallucinations and delusions at this time   Client was screened for the following SDOH: Financials, exercise, social interaction, depression, and housing  Assessment Information that integrates subjective and objective details with a therapist's professional interpretation:    Patient was alert and oriented x 5.  Patient was pleasant, cooperative, maintained good eye contact.  Jeremy Kennedy with depressed and anxious mood\affect.  Patient comes Kennedy accompanied by his mother.  Patient reports worsening depression and anxiety due to anhedonia.  Patient endorses symptoms for worthlessness, hopelessness, lack of motivation, tension, worry, fatigue, and oversleeping.  Patient reports that things have been improving since starting medication 1 week ago.  Patient reports that most days are spent in the house either sleeping, playing video games, or watching YouTube videos.  Patient reports Kennedy that he would like to create coping skills to further his motivation and create opportunities for himself.  LCSW recommended to  patient that he be seen every 2 weeks based on providers scheduling and patient was agreeable.  Patient was scheduled for next available appointment.  Client states use of the following substances: none reported      Clinician assisted client with scheduling the following appointments: Aug 6th 4pm. Clinician details of appointment.    Client was in agreement with treatment recommendations.      I discussed the assessment and treatment plan with the patient. The patient was provided an opportunity to ask questions and all were answered. The patient agreed with the plan and demonstrated an understanding of the instructions.   The patient was advised to call back or seek an in-person evaluation if the symptoms worsen or if the condition fails to improve as anticipated.  I provided 45 minutes of non-face-to-face time during this encounter.   Jeremy Cooks, LCSW    CCA Screening, Triage and Referral (STR)  Patient Reported Information Referral name: Mother  Whom do you see for routine medical problems? Primary Care  Practice/Facility Name: [facility]  Name of Contact: Jeremy Erp,   MD  What Is the Reason for Your Visit/Call Kennedy? No data recorded How Long Has This Been Causing You Problems? > than 6 months  What Do You Feel Would Help You the Most Kennedy? Treatment for Depression or other mood problem; Stress Management; Medication(s)  Have You Recently Been in Any Inpatient Treatment (Kennedy/Detox/Crisis Center/28-Day Program)? No  Have You Ever Received Services From [facility] Before? Yes  Who Do You [facility] at [facility]? [facility]  Have You Recently Had Any Thoughts About Hurting Yourself? No  Are You Planning to Commit Suicide/Harm  Yourself At This time? No  Have you Recently Had Thoughts About Hurting Someone Jeremy Kennedy? No  Have You Used Any Alcohol or Drugs in the Past 24 Hours? No  Do You Currently Have a Therapist/Psychiatrist?  Yes  Name of Therapist/Psychiatrist: PA at Jeremy Kennedy   Have You Been Recently Discharged From Any Office Practice or Programs? No   CCA Screening Triage Referral Assessment Type of Contact: Face-to-Face  Is CPS involved or ever been involved? Never  Is APS involved or ever been involved? Never  Patient Determined To Be At Risk for Harm To Self or Others Based on Review of Patient Reported Information or Presenting Complaint? No  Method: No Plan  Availability of Means: No access or NA  Intent: Vague intent or NA  Notification Required: No need or identified person   Are There Guns or Other Weapons in Your Home? No   Location of Assessment: [facility]   Does Patient Present under Involuntary Commitment? No   [place] of Residence: [place]   Patient Currently Receiving the Following Services: Medication Management   Options For Referral: Outpatient Therapy     CCA Biopsychosocial Intake/Chief Complaint:  Client reported a history of Major depressive disorder. Client reported depression has been a constant issue for the past 8 years.  Current Symptoms/Problems: client reported depressed mood, lack of motivtion/ intrests in activity, forgetfulness   Patient Reported Schizophrenia/Schizoaffective Diagnosis in Past: No   Strengths: family support  Preferences: medication management  Abilities: vocalize problems and needs   Type of Services Patient Feels are Needed: medication management   Initial Clinical Notes/Concerns: No data recorded  Mental Health Symptoms Depression:   Change in energy/activity; Difficulty Concentrating; Hopelessness; Worthlessness   Duration of Depressive symptoms:  Greater than two weeks   Mania:   None   Anxiety:    Worrying; Tension; Irritability   Psychosis:   None   Duration of Psychotic symptoms: No data recorded  Trauma:   None   Obsessions:   None   Compulsions:   None    Inattention:   None   Hyperactivity/Impulsivity:   None   Oppositional/Defiant Behaviors:   None   Emotional Irregularity:   None   Other Mood/Personality Symptoms:  No data recorded   Mental Status Exam Appearance and self-care  Stature:   Tall   Weight:   Obese   Clothing:   Casual   Grooming:   Normal   Cosmetic use:   Age appropriate   Posture/gait:   Normal   Motor activity:   Not Remarkable   Sensorium  Attention:   Normal   Concentration:   Normal   Orientation:   X5   Recall/memory:   Normal   Affect and Mood  Affect:   Depressed; Anxious   Mood:   Depressed   Relating  Eye contact:   Normal   Facial expression:   Depressed   Attitude toward examiner:   Cooperative   Thought and Language  Speech flow:  Clear and Coherent   Thought content:   Appropriate to Mood and Circumstances   Preoccupation:   None   Hallucinations:   None   Organization:  No data recorded  [employer] of Knowledge:   Good   Intelligence:   Average   Abstraction:   Normal   Judgement:   Good   Reality Testing:   Adequate   Insight:   Good   Decision Making:   Normal  Social Functioning  Social Maturity:   Responsible; Isolates   Social Judgement:   Normal   Stress  Stressors:   Transitions   Coping Ability:   Human resources officer Deficits:   Activities of daily living; Self-care   Supports:   Family     Religion: Religion/Spirituality Are You A Religious Person?: No  Leisure/Recreation: Leisure / Recreation Do You Have Hobbies?: Yes Leisure and Hobbies: video games  Exercise/Diet: Exercise/Diet Do You Exercise?: No Have You Gained or Lost A Significant Amount of Weight in the Past Six Months?: No Do You Follow a Special Diet?: No Do You Have Any Trouble Sleeping?: No   CCA Employment/Education Employment/Work Situation: Employment / Work Situation Employment Situation:  Unemployed Patient's Job has Been Impacted by Current Illness: No Has Patient ever Been in Equities trader?: No  Education: Education Is Patient Currently Attending School?: No Last Grade Completed: 9 Name of High School: [employer] Did You Graduate From [employer]?: No (Client reported he left school due to being bullied over the years.) Did You Attend College?: No Did Designer, television/film set?: No Did You Have An Individualized Education Program (IIEP): No Did You Have Any Difficulty At [employer]?: No Patient's Education Has Been Impacted by Current Illness: No   CCA Family/Childhood History Family and Relationship History: Family history Marital status: Single Are you sexually active?: No What is your sexual orientation?: pansexual Has your sexual activity been affected by drugs, alcohol, medication, or emotional stress?: none reported Does patient have children?: No  Childhood History:  Childhood History By whom was/is the patient raised?: Mother Additional childhood history information: Client reported he is from [place] and was raised by his mother. client reported his childhood were his best years because he does not remember them. Does patient have siblings?: Yes Number of Siblings: 1 Description of patient's current relationship with siblings: Client reported he has a sister. Did patient suffer any verbal/emotional/physical/sexual abuse as a child?: No Did patient suffer from severe childhood neglect?: No Has patient ever been sexually abused/assaulted/raped as an adolescent or adult?: No Was the patient ever a victim of a crime or a disaster?: No Witnessed domestic violence?: No Has patient been affected by domestic violence as an adult?: No  Child/Adolescent Assessment:     CCA Substance Use Alcohol/Drug Use: Alcohol / Drug Use History of alcohol / drug use?: No history of alcohol / drug abuse                         ASAM's:  Six  Dimensions of Multidimensional Assessment  Dimension 1:  Acute Intoxication and/or Withdrawal Potential:      Dimension 2:  Biomedical Conditions and Complications:      Dimension 3:  Emotional, Behavioral, or Cognitive Conditions and Complications:     Dimension 4:  Readiness to Change:     Dimension 5:  Relapse, Continued use, or Continued Problem Potential:     Dimension 6:  Recovery/Living Environment:     ASAM Severity Score:    ASAM Recommended Level of Treatment:     Substance use Disorder (SUD)    Recommendations for Services/Supports/Treatments: Recommendations for Services/Supports/Treatments Recommendations For Services/Supports/Treatments: Medication Management  DSM5 Diagnoses: Patient Active Problem List   Diagnosis Date Noted   Generalized anxiety disorder 04/14/2023   Depression, major, single episode, severe (HCC) 11/08/2022   Vitamin D deficiency 06/13/2015   Obesity, Class III, BMI 40-49.9 (morbid obesity) (HCC) 06/07/2015  Referrals to Alternative Service(s): Referred to Alternative Service(s):   Place:   Date:   Time:    Referred to Alternative Service(s):   Place:   Date:   Time:    Referred to Alternative Service(s):   Place:   Date:   Time:    Referred to Alternative Service(s):   Place:   Date:   Time:      Collaboration of Care: Other Referral for individual therapy   Patient/Guardian was advised Release of Information must be obtained prior to any record release in order to collaborate their care with an outside provider. Patient/Guardian was advised if they have not already done so to contact the registration department to sign all necessary forms in order for [place] to release information regarding their care.   Consent: Patient/Guardian gives verbal consent for treatment and assignment of benefits for services provided during this visit. Patient/Guardian expressed understanding and agreed to proceed.   Jeremy Cooks, LCSW

## 2023-06-18 ENCOUNTER — Telehealth (INDEPENDENT_AMBULATORY_CARE_PROVIDER_SITE_OTHER): Payer: Medicaid Other | Admitting: Physician Assistant

## 2023-06-18 ENCOUNTER — Encounter (HOSPITAL_COMMUNITY): Payer: Self-pay | Admitting: Physician Assistant

## 2023-06-18 DIAGNOSIS — F411 Generalized anxiety disorder: Secondary | ICD-10-CM | POA: Diagnosis not present

## 2023-06-18 DIAGNOSIS — F322 Major depressive disorder, single episode, severe without psychotic features: Secondary | ICD-10-CM | POA: Diagnosis not present

## 2023-06-18 MED ORDER — SERTRALINE HCL 100 MG PO TABS: 100.0000 mg | ORAL_TABLET | Freq: Every day | ORAL | 1 refills | Status: DC

## 2023-06-18 MED ORDER — BUPROPION HCL ER (XL) 150 MG PO TB24: 150.0000 mg | ORAL_TABLET | Freq: Every day | ORAL | 1 refills | Status: DC

## 2023-06-18 NOTE — Progress Notes (Signed)
BH MD/PA/NP OP Progress Note  Virtual Visit via Video Note  I connected with Jeremy Kennedy on 06/18/23 at  5:00 PM EDT by a video enabled telemedicine application and verified that I am speaking with the correct person using two identifiers.  Location: Patient: Home Provider: Clinic   I discussed the limitations of evaluation and management by telemedicine and the availability of in person appointments. The patient expressed understanding and agreed to proceed.  Follow Up Instructions:   I discussed the assessment and treatment plan with the patient. The patient was provided an opportunity to ask questions and all were answered. The patient agreed with the plan and demonstrated an understanding of the instructions.   The patient was advised to call back or seek an in-person evaluation if the symptoms worsen or if the condition fails to improve as anticipated.  I provided 24 minutes of non-face-to-face time during this encounter.  Meta Hatchet, PA    06/18/2023 6:04 PM Nikko Silguero  MRN:  [id number]  Chief Complaint:  Chief Complaint  Patient presents with   Follow-up   Medication Refill   HPI:   Jeremy Kennedy is a 27 year old, African-American male with a past psychiatric history significant for generalized anxiety disorder and major depressive disorder who presents to [facility], accompanied by his mother Zyire Eidson, [phone]), via virtual video visit for follow-up and medication management.  Patient is currently being managed on the following psychiatric medications:  Lexapro 20 mg daily Bupropion (Wellbutrin XL) 150 mg 24-hour tablet daily  Patient believes that his medications are working.  He reports that he has been experiencing bright flickers of light in his field of vision.  Patient states that these lights occur at the peripheral aspect of his vision and are painless.  He reports that the lights happen roughly 4-5 times a  day.  Although patient denies pain associated with the light, he notes that the lights are a source of irritation for him.  Patient continues to endorse depression and rates his depression as 4 out of 10 with 10 being most severe.  Patient endorses depression 6 days out of the week with symptoms lasting part of the day.  Patient endorses the following depressive symptoms: low mood, decreased concentration, lack of motivation, decreased energy, and feelings of guilt/worthlessness.  Patient denies hopelessness or irritability.  He reports that he still lacks interest in activities he likes doing.  Patient reports that his depression is worsened by spur of the moment thoughts related to his past.  Patient denies any anxiety at this time.  Patient is alert and oriented x 4, calm, cooperative, and fully engaged in conversation during the encounter.  Patient endorses happy mood.  Patient denies suicidal or homicidal ideations.  He further denies auditory or visual hallucinations and does not appear to be responding to internal/external stimuli.  Patient endorses fair sleep and receives on average 4 to 6 hours of sleep each night.  Patient endorses decreased appetite and eats on average 1 meal per day.  Patient denies alcohol consumption, tobacco use, or illicit drug use.  Visit Diagnosis:    ICD-10-CM   1. Depression, major, single episode, severe (HCC)  F32.2 buPROPion (WELLBUTRIN XL) 150 MG 24 hr tablet    sertraline (ZOLOFT) 100 MG tablet    2. Generalized anxiety disorder  F41.1 sertraline (ZOLOFT) 100 MG tablet      Past Psychiatric History:  Patient has been diagnosed with major depression, single episode, severe.  Patient has been placed on Wellbutrin XL 150 mg 24-hour tablet daily and escitalopram 10 mg daily   Patient denies past history of hospitalization due to mental health   Patient denies a past history of suicide attempts but states that he has had multiple thoughts of wanting to harm  himself   Patient denies a past history of homicide  Past Medical History:  Past Medical History:  Diagnosis Date   Obesity    History reviewed. No pertinent surgical history.  Family Psychiatric History:  Patient denies family history of psychiatric illness   Family history of suicide attempts: Patient denies Family history of homicide attempts: Patient denies Family history of substance abuse: Patient denies  Family History:  Family History  Problem Relation Age of Onset   Obesity Mother    Obesity Sister    Cancer Maternal Grandmother    Diabetes Maternal Grandfather    Kidney disease Maternal Grandfather    Obesity Unknown     Social History:  Social History   Socioeconomic History   Marital status: Single    Spouse name: Not on file   Number of children: Not on file   Years of education: Not on file   Highest education level: Not on file  Occupational History   Not on file  Tobacco Use   Smoking status: Never    Passive exposure: Never   Smokeless tobacco: Never  Substance and Sexual Activity   Alcohol use: No    Alcohol/week: 0.0 standard drinks of alcohol   Drug use: No   Sexual activity: Never  Other Topics Concern   Not on file  Social History Narrative   Lives with Mom, sister and niece   Social Determinants of Health   Financial Resource Strain: High Risk (06/02/2023)   Overall Financial Resource Strain (CARDIA)    Difficulty of Paying Living Expenses: Very hard  Food Insecurity: No Food Insecurity (06/02/2023)   Hunger Vital Sign    Worried About Running Out of Food in the Last Year: Never true    Ran Out of Food in the Last Year: Never true  Transportation Needs: No Transportation Needs (06/02/2023)   PRAPARE - Administrator, Civil Service (Medical): No    Lack of Transportation (Non-Medical): No  Physical Activity: Inactive (06/02/2023)   Exercise Vital Sign    Days of Exercise per Week: 0 days    Minutes of Exercise per Session:  0 min  Stress: No Stress Concern Present (06/02/2023)   [employer] of Occupational Health - Occupational Stress Questionnaire    Feeling of Stress : Only a little  Social Connections: Socially Isolated (06/02/2023)   Social Connection and Isolation Panel [NHANES]    Frequency of Communication with Friends and Family: Once a week    Frequency of Social Gatherings with Friends and Family: Never    Attends Religious Services: Never    Database administrator or Organizations: No    Attends Banker Meetings: Never    Marital Status: Never married    Allergies:  Allergies  Allergen Reactions   Shellfish Allergy Nausea Only    Metabolic Disorder Labs: Lab Results  Component Value Date   HGBA1C 5.7 (H) 06/07/2015   MPG 117 (H) 06/07/2015   No results found for: "PROLACTIN" Lab Results  Component Value Date   CHOL 170 (H) 06/07/2015   TRIG 215 (H) 06/07/2015   HDL 30 (L) 06/07/2015   CHOLHDL 5.7 06/07/2015   VLDL  43 (H) 06/07/2015   LDLCALC 97 06/07/2015   Lab Results  Component Value Date   TSH 4.964 06/07/2015    Therapeutic Level Labs: No results found for: "LITHIUM" No results found for: "VALPROATE" No results found for: "CBMZ"  Current Medications: Current Outpatient Medications  Medication Sig Dispense Refill   buPROPion (WELLBUTRIN XL) 150 MG 24 hr tablet Take 1 tablet (150 mg total) by mouth daily. 30 tablet 1   sertraline (ZOLOFT) 100 MG tablet Take 1 tablet (100 mg total) by mouth daily. 30 tablet 1   No current facility-administered medications for this visit.     Musculoskeletal: Strength & Muscle Tone: within normal limits Gait & Station: normal Patient leans: N/A  Psychiatric Specialty Exam: Review of Systems  Psychiatric/Behavioral:  Positive for sleep disturbance. Negative for decreased concentration, dysphoric mood, hallucinations, self-injury and suicidal ideas. The patient is nervous/anxious. The patient is not hyperactive.      There were no vitals taken for this visit.There is no height or weight on file to calculate BMI.  General Appearance: Casual  Eye Contact:  Good  Speech:  Clear and Coherent and Normal Rate  Volume:  Normal  Mood:  Anxious and Depressed  Affect:  Appropriate  Thought Process:  Coherent, Goal Directed, and Descriptions of Associations: Intact  Orientation:  Full (Time, Place, and Person)  Thought Content: WDL   Suicidal Thoughts:  No  Homicidal Thoughts:  No  Memory:  Immediate;   Good Recent;   Good Remote;   Fair  Judgement:  Fair  Insight:  Fair  Psychomotor Activity:  Normal  Concentration:  Concentration: Good and Attention Span: Good  Recall:  Good  Fund of Knowledge: Good  Language: Good  Akathisia:  No  Handed:  Right  AIMS (if indicated): not done  Assets:  Communication Skills Desire for Improvement Housing Social Support  ADL's:  Intact  Cognition: WNL  Sleep:  Fair   Screenings: GAD-7    Flowsheet Row Video Visit from 06/18/2023 in [facility] Counselor from 06/02/2023 in [facility] Clinical Support from 05/22/2023 in [facility] Clinical Support from 04/14/2023 in [facility] Office Visit from 03/03/2023 in [facility]  Total GAD-7 Score 3 4 9 8 8       PHQ2-9    Flowsheet Row Video Visit from 06/18/2023 in [facility] Counselor from 06/02/2023 in [facility] Clinical Support from 05/22/2023 in [facility] Clinical Support from 04/14/2023 in [facility] Office Visit from 03/03/2023 in [facility]  PHQ-2 Total Score 3 4 5 5 5   PHQ-9 Total Score 13 14 15 19 21       Flowsheet Row Video Visit from 06/18/2023 in [facility] Counselor from 06/02/2023 in [facility] Clinical Support from 05/22/2023 in [facility]  C-SSRS RISK CATEGORY No Risk No Risk Low Risk        Assessment and Plan:   Roddie Reiley is a 27 year old, African-American male with a past psychiatric history significant for generalized anxiety disorder and major depressive disorder who presents to [facility], accompanied by his mother Deagan Sevin, [phone]), via virtual video visit for follow-up and medication management.  Patient presents to the encounter stating that his medications appear to be working.  Patient also notes bright  flickers of light occurring at the periphery of his vision and believes that the lights are attributed to the use of his medications.  Patient continues to endorse depression but denies anxiety.  Although patient continues to endorse ongoing depression, patient would like to continue taking his medications as prescribed.  Patient's medications to be e-prescribed to pharmacy of choice.  Collaboration of Care: Collaboration of Care: Medication Management AEB provider managing patient's psychiatric medications, Psychiatrist AEB patient being followed by mental health provider at this facility, and Referral or follow-up with counselor/therapist AEB patient to be set up with a licensed clinical social worker at this facility following the conclusion of the encounter  Patient/Guardian was advised Release of Information must be obtained prior to any record release in order to collaborate their care with an outside provider. Patient/Guardian was advised if they have not already done so to contact the registration department to sign all necessary forms in order for [place] to release information regarding their care.   Consent: Patient/Guardian gives verbal consent for treatment and assignment of benefits for services provided during this visit. Patient/Guardian expressed  understanding and agreed to proceed.   1. Depression, major, single episode, severe (HCC)  - buPROPion (WELLBUTRIN XL) 150 MG 24 hr tablet; Take 1 tablet (150 mg total) by mouth daily.  Dispense: 30 tablet; Refill: 1 - sertraline (ZOLOFT) 100 MG tablet; Take 1 tablet (100 mg total) by mouth daily.  Dispense: 30 tablet; Refill: 1  2. Generalized anxiety disorder  - sertraline (ZOLOFT) 100 MG tablet; Take 1 tablet (100 mg total) by mouth daily.  Dispense: 30 tablet; Refill: 1  Patient to follow-up in 6 weeks Provider spent a total of 24 minutes with the patient/reviewing patient's chart  Meta Hatchet, PA 06/18/2023, 6:04 PM

## 2023-07-06 ENCOUNTER — Other Ambulatory Visit (HOSPITAL_COMMUNITY): Payer: Self-pay | Admitting: Physician Assistant

## 2023-07-06 DIAGNOSIS — F322 Major depressive disorder, single episode, severe without psychotic features: Secondary | ICD-10-CM

## 2023-07-06 DIAGNOSIS — F411 Generalized anxiety disorder: Secondary | ICD-10-CM

## 2023-07-07 ENCOUNTER — Ambulatory Visit (INDEPENDENT_AMBULATORY_CARE_PROVIDER_SITE_OTHER): Payer: Medicaid Other | Admitting: Licensed Clinical Social Worker

## 2023-07-07 ENCOUNTER — Telehealth (HOSPITAL_COMMUNITY): Payer: Self-pay | Admitting: Licensed Clinical Social Worker

## 2023-07-07 DIAGNOSIS — F322 Major depressive disorder, single episode, severe without psychotic features: Secondary | ICD-10-CM | POA: Diagnosis not present

## 2023-07-07 DIAGNOSIS — F411 Generalized anxiety disorder: Secondary | ICD-10-CM

## 2023-07-07 NOTE — Progress Notes (Signed)
THERAPIST PROGRESS NOTE  Session Time: 28  Participation Level: Active  Behavioral Response: CasualAlertAnxious and Depressed  Type of Therapy: Individual Therapy  Treatment Goals addressed:  Active     Anxiety     LTG: Jeremy Kennedy will score less than 5 on the Generalized Anxiety Disorder 7 Scale (GAD-7)      Start:  06/02/23    Expected End:  10/30/23         Identify 3 triggers for anxiety      Start:  06/02/23    Expected End:  10/30/23         Identify 3 coping skills for anxiety.  (Progressing)     Start:  06/02/23    Expected End:  10/30/23         Discuss risks and benefits of medication treatment options for this problem and prescribe as indicated     Start:  06/02/23         Encourage Jeremy Kennedy to take psychotropic medication(s) as prescribed     Start:  06/02/23         Review results of GAD-7 with Jeremy Kennedy to track progress     Start:  06/02/23         Work with Jeremy Kennedy to track symptoms, triggers, and/or skill use through a mood chart, diary card, or journal     Start:  06/02/23         Perform psychoeducation regarding anxiety disorders     Start:  06/02/23         Provide Jeremy Kennedy with educational information and reading material on anxiety, its causes, and symptoms.      Start:  06/02/23         Work with patient individually to identify the major components of a recent episode of anxiety: physical symptoms, major thoughts and images, and major behaviors they experienced     Start:  06/02/23           OP Depression     LTG: Increase coping skills to manage depression and improve ability to perform daily activities (Progressing)     Start:  01/26/23    Expected End:  10/30/23         STG: Jeremy Kennedy will complete at least 80% of assigned homework  (Initial)     Start:  01/26/23    Expected End:  10/30/23         LTG: Jeremy Kennedy WILL SCORE LESS THAN 10 ON THE PATIENT HEALTH QUESTIONNAIRE (PHQ-9)     Start:  06/02/23    Expected End:   10/30/23            Therapist will administer the PHQ-9 at weekly intervals for the next 12 weeks     Start:  01/26/23            ProgressTowards Goals: Progressing  Interventions: CBT, Motivational Interviewing, and Supportive  Summary: Jeremy Kennedy is a 27 y.o. male who presents with depressed and flat mood\affect.  Patient was pleasant, cooperative, maintained good eye contact.  He engaged well in therapy session was dressed casually.  Patient and mother were both present in session.  Patient reports symptoms for the lack of motivation, isolation, worthlessness, hopelessness, and overeating.  Patient reports that most days are spent in his room playing video games.  Mother states that she tries to provide chores for him to do but he picks and chooses 1 to do them and mom does not want to push him too far.  Patient reports that he is attempted to go for walks before but is afraid of how much pressure will put on his bones due to being overweight.  Patient reports no activities that he enjoys outside of the home.  Suicidal/Homicidal: Nowithout intent/plan  Therapist Response:   Intervention\plan: LCSW, patient and mother discussed today activities that they can engage in outside of the home such as bowling and going golfing such as driving range or joining a bowling league.  LCSW spoke with patient about nutrition.  LCSW advised patient to get a referral from primary care physician for dietitian.  LCSW encourage patient to walk and how it can decrease anxiety and depression.  LCSW advised patient to utilize positive affirmations daily to increase hopefulness.  Plan for patient is to engage in at least 1 activity outside the household in the next 3 weeks and go for a walk 1 time weekly.     Plan: Return again in 4 weeks.  Diagnosis: Depression, major, single episode, severe (HCC)  Generalized anxiety disorder  Collaboration of Care: Other None reports   Patient/Guardian was advised  Release of Information must be obtained prior to any record release in order to collaborate their care with an outside provider. Patient/Guardian was advised if they have not already done so to contact the registration department to sign all necessary forms in order for [place] to release information regarding their care.   Consent: Patient/Guardian gives verbal consent for treatment and assignment of benefits for services provided during this visit. Patient/Guardian expressed understanding and agreed to proceed.   [name], LCSW 07/08/2023

## 2023-07-07 NOTE — Telephone Encounter (Signed)
Pt requesting a call back due to having symptoms of excessive sweating. Mood having no change with medications.

## 2023-07-22 ENCOUNTER — Ambulatory Visit (INDEPENDENT_AMBULATORY_CARE_PROVIDER_SITE_OTHER): Payer: Medicaid Other | Admitting: Licensed Clinical Social Worker

## 2023-07-22 DIAGNOSIS — F411 Generalized anxiety disorder: Secondary | ICD-10-CM

## 2023-07-22 DIAGNOSIS — F322 Major depressive disorder, single episode, severe without psychotic features: Secondary | ICD-10-CM | POA: Diagnosis not present

## 2023-07-22 NOTE — Progress Notes (Signed)
THERAPIST PROGRESS NOTE  Session Time: 55   Participation Level: Active  Behavioral Response: CasualAlertAnxious and Depressed  Type of Therapy: Individual Therapy  Treatment Goals addressed:  Active     Anxiety     LTG: [name] will score less than 5 on the Generalized Anxiety Disorder 7 Scale (GAD-7)      Start:  06/02/23    Expected End:  10/30/23         Identify 3 triggers for anxiety      Start:  06/02/23    Expected End:  10/30/23       Goal Note     Being over weight         Identify 3 coping skills for anxiety.  (Progressing)     Start:  06/02/23    Expected End:  10/30/23         Discuss risks and benefits of medication treatment options for this problem and prescribe as indicated     Start:  06/02/23         Encourage Jeremy Kennedy to take psychotropic medication(s) as prescribed     Start:  06/02/23         Review results of GAD-7 with Jeremy Kennedy to track progress     Start:  06/02/23         Work with Jeremy Kennedy to track symptoms, triggers, and/or skill use through a mood chart, diary card, or journal     Start:  06/02/23         Perform psychoeducation regarding anxiety disorders     Start:  06/02/23         Provide Jeremy Kennedy with educational information and reading material on anxiety, its causes, and symptoms.      Start:  06/02/23         Work with patient individually to identify the major components of a recent episode of anxiety: physical symptoms, major thoughts and images, and major behaviors they experienced     Start:  06/02/23           OP Depression     LTG: Increase coping skills to manage depression and improve ability to perform daily activities (Progressing)     Start:  01/26/23    Expected End:  10/30/23         STG: Jeremy Kennedy will complete at least 80% of assigned homework  (Progressing)     Start:  01/26/23    Expected End:  10/30/23         LTG: Jeremy Kennedy WILL SCORE LESS THAN 10 ON THE PATIENT HEALTH QUESTIONNAIRE  (PHQ-9)     Start:  06/02/23    Expected End:  10/30/23            Therapist will administer the PHQ-9 at weekly intervals for the next 12 weeks     Start:  01/26/23            ProgressTowards Goals: Progressing  Interventions: CBT and Motivational Interviewing  Summary: Jeremy Kennedy is a 27 y.o. male who presents with depressed and flat mood\affect.  Patient was pleasant, cooperative, maintained good eye contact.  He engaged well in therapy session was dressed casually.  Jeremy Kennedy and oriented x 5.  Patient comes in today with primary stressors of self-motivation.  Patient reports that his mom who was present also in session scheduled him appointments with primary care physician and a dietitian.  Patient reports that he was unable to engage in activities such as driving range or bowling.  He reports that he has been able to handle some chores such as cleaning his room.  Patient reports that he spends most of his days in his room playing video games or on his cell phone.  Suicidal/Homicidal: Nowithout intent/plan  Therapist Response:     Interventions/Plan: LCSW educated patient on organizational skills such as utilizing a planner to plan out days.  This would include things such as time to wake up, personal care, times to eat, and time for chores.  LCSW encourage patient to go for walks to help decrease anxiety, depression and increased health benefits.  LCSW psycho analytic therapy for patient to express thoughts, feelings and emotions utilizing free association.  Plan: Return again in 3 weeks.  Diagnosis: Depression, major, single episode, severe (HCC)  Generalized anxiety disorder  Collaboration of Care: Other None today   Patient/Guardian was advised Release of Information must be obtained prior to any record release in order to collaborate their care with an outside provider. Patient/Guardian was advised if they have not already done so to contact the registration department to  sign all necessary forms in order for [place] to release information regarding their care.   Consent: Patient/Guardian gives verbal consent for treatment and assignment of benefits for services provided during this visit. Patient/Guardian expressed understanding and agreed to proceed.   Jeremy Cooks, LCSW 07/22/2023

## 2023-07-29 DIAGNOSIS — H519 Unspecified disorder of binocular movement: Secondary | ICD-10-CM | POA: Diagnosis not present

## 2023-07-29 DIAGNOSIS — R6889 Other general symptoms and signs: Secondary | ICD-10-CM | POA: Diagnosis not present

## 2023-07-29 DIAGNOSIS — R41840 Attention and concentration deficit: Secondary | ICD-10-CM | POA: Diagnosis not present

## 2023-07-29 DIAGNOSIS — R400 Somnolence: Secondary | ICD-10-CM | POA: Diagnosis not present

## 2023-07-29 DIAGNOSIS — R0683 Snoring: Secondary | ICD-10-CM | POA: Diagnosis not present

## 2023-07-30 ENCOUNTER — Ambulatory Visit (INDEPENDENT_AMBULATORY_CARE_PROVIDER_SITE_OTHER): Payer: Medicaid Other | Admitting: Physician Assistant

## 2023-07-30 DIAGNOSIS — F3341 Major depressive disorder, recurrent, in partial remission: Secondary | ICD-10-CM | POA: Diagnosis not present

## 2023-07-30 DIAGNOSIS — Z1329 Encounter for screening for other suspected endocrine disorder: Secondary | ICD-10-CM | POA: Diagnosis not present

## 2023-07-30 DIAGNOSIS — F322 Major depressive disorder, single episode, severe without psychotic features: Secondary | ICD-10-CM | POA: Diagnosis not present

## 2023-07-30 DIAGNOSIS — R635 Abnormal weight gain: Secondary | ICD-10-CM | POA: Diagnosis not present

## 2023-07-30 DIAGNOSIS — Z130 Encounter for screening for diseases of the blood and blood-forming organs and certain disorders involving the immune mechanism: Secondary | ICD-10-CM | POA: Diagnosis not present

## 2023-07-30 DIAGNOSIS — Z13220 Encounter for screening for lipoid disorders: Secondary | ICD-10-CM | POA: Diagnosis not present

## 2023-07-30 DIAGNOSIS — Z6841 Body Mass Index (BMI) 40.0 and over, adult: Secondary | ICD-10-CM | POA: Diagnosis not present

## 2023-07-30 DIAGNOSIS — F411 Generalized anxiety disorder: Secondary | ICD-10-CM

## 2023-07-30 DIAGNOSIS — Z125 Encounter for screening for malignant neoplasm of prostate: Secondary | ICD-10-CM | POA: Diagnosis not present

## 2023-07-30 DIAGNOSIS — E559 Vitamin D deficiency, unspecified: Secondary | ICD-10-CM | POA: Diagnosis not present

## 2023-07-30 DIAGNOSIS — Z131 Encounter for screening for diabetes mellitus: Secondary | ICD-10-CM | POA: Diagnosis not present

## 2023-07-30 MED ORDER — SERTRALINE HCL 50 MG PO TABS: 150.0000 mg | ORAL_TABLET | Freq: Every day | ORAL | 1 refills | Status: DC

## 2023-07-30 MED ORDER — BUPROPION HCL ER (XL) 300 MG PO TB24
300.0000 mg | ORAL_TABLET | Freq: Every day | ORAL | 1 refills | Status: DC
Start: 2023-07-30 — End: 2024-02-12

## 2023-07-30 NOTE — Progress Notes (Signed)
BH MD/PA/NP OP Progress Note  08/02/2023 2:07 PM Jeremy Kennedy  MRN:  [id number]  Chief Complaint:  Chief Complaint  Patient presents with   Follow-up   Medication Management   HPI:   Jeremy Kennedy is a 27 year old, African-American male with a past psychiatric history significant for generalized anxiety disorder and major depressive disorder who presents to [facility], accompanied by his mother Jeremy Kennedy, [phone]), for follow-up and medication management.  Patient is currently being managed on the following psychiatric medications:  Zoloft 100 mg daily Bupropion (Wellbutrin XL) 150 mg 24-hour tablet daily  Patient presents to the encounter expressing that he does not feel that his medications are working.  He reports that he believes that he tricked himself into thinking the medications were working in the past.  He reports that his depression continues to be the same.  Patient rates his depression as 6 out of 10 with 10 being most severe.  He endorses depressive episodes every day with symptoms lasting part of the day.  Patient endorses the following depressive symptoms: lack of motivation, decreased concentration, feelings of sadness, fatigue, feelings of guilt/worthlessness, and hopelessness.  He denies irritability, crying spells, or excessive worrying.  Patient denies anxiety and further denies any stressors at this time.  A PHQ-9 screen was performed with the patient scoring a 21.  A GAD-7 screen was also performed with the patient scoring a 5.  Patient is alert and oriented x 4, calm, cooperative, and fully engaged in conversation during the encounter.  Patient endorses bland mood.  Patient denies suicidal or homicidal ideations.  He further denies auditory or visual hallucinations and does not appear to be responding to internal/external stimuli.  Patient endorses fair sleep and receives on average 6 hours of sleep per night.  Patient endorses  fair appetite and eats on average 2 meals per day.  Patient denies alcohol consumption, tobacco use, or illicit drug use.  Visit Diagnosis:    ICD-10-CM   1. Depression, major, single episode, severe (HCC)  F32.2 buPROPion (WELLBUTRIN XL) 300 MG 24 hr tablet    sertraline (ZOLOFT) 50 MG tablet    2. Generalized anxiety disorder  F41.1 sertraline (ZOLOFT) 50 MG tablet       Past Psychiatric History:  Patient has been diagnosed with major depression, single episode, severe.  Patient has been placed on Wellbutrin XL 150 mg 24-hour tablet daily and escitalopram 10 mg daily   Patient denies past history of hospitalization due to mental health   Patient denies a past history of suicide attempts but states that he has had multiple thoughts of wanting to harm himself   Patient denies a past history of homicide  Past Medical History:  Past Medical History:  Diagnosis Date   Obesity    History reviewed. No pertinent surgical history.  Family Psychiatric History:  Patient denies family history of psychiatric illness   Family history of suicide attempts: Patient denies Family history of homicide attempts: Patient denies Family history of substance abuse: Patient denies  Family History:  Family History  Problem Relation Age of Onset   Obesity Mother    Obesity Sister    Cancer Maternal Grandmother    Diabetes Maternal Grandfather    Kidney disease Maternal Grandfather    Obesity Unknown     Social History:  Social History   Socioeconomic History   Marital status: Single    Spouse name: Not on file   Number of children: Not on  file   Years of education: Not on file   Highest education level: Not on file  Occupational History   Not on file  Tobacco Use   Smoking status: Never    Passive exposure: Never   Smokeless tobacco: Never  Substance and Sexual Activity   Alcohol use: No    Alcohol/week: 0.0 standard drinks of alcohol   Drug use: No   Sexual activity: Never   Other Topics Concern   Not on file  Social History Narrative   Lives with Mom, sister and niece   Social Determinants of Health   Financial Resource Strain: Low Risk  (07/29/2023)   Received from [facility]   Overall Financial Resource Strain (CARDIA)    Difficulty of Paying Living Expenses: Not hard at all  Recent Concern: Financial Resource Strain - High Risk (06/02/2023)   Overall Financial Resource Strain (CARDIA)    Difficulty of Paying Living Expenses: Very hard  Food Insecurity: No Food Insecurity (07/29/2023)   Received from [facility]   Hunger Vital Sign    Worried About Running Out of Food in the Last Year: Never true    Ran Out of Food in the Last Year: Never true  Transportation Needs: No Transportation Needs (07/29/2023)   Received from [facility] - Transportation    Lack of Transportation (Medical): No    Lack of Transportation (Non-Medical): No  Physical Activity: Inactive (06/02/2023)   Exercise Vital Sign    Days of Exercise per Week: 0 days    Minutes of Exercise per Session: 0 min  Stress: No Stress Concern Present (06/02/2023)   [employer] [facility] Occupational [facility] - Occupational Stress Questionnaire    Feeling of Stress : Only a little  Social Connections: Unknown (07/22/2023)   Received from [facility]   Social Network    Social Network: Not on file  Recent Concern: Social Connections - Socially Isolated (06/02/2023)   Social Connection and Isolation Panel [NHANES]    Frequency of Communication with Friends and Family: Once a week    Frequency of Social Gatherings with Friends and Family: Never    Attends Religious Services: Never    Database administrator or Organizations: No    Attends Banker Meetings: Never    Marital Status: Never married    Allergies:  Allergies  Allergen Reactions   Shellfish Allergy Nausea Only    Metabolic Disorder Labs: Lab Results  Component Value Date   HGBA1C 5.7 (H) 06/07/2015    MPG 117 (H) 06/07/2015   No results found for: "PROLACTIN" Lab Results  Component Value Date   CHOL 170 (H) 06/07/2015   TRIG 215 (H) 06/07/2015   HDL 30 (L) 06/07/2015   CHOLHDL 5.7 06/07/2015   VLDL 43 (H) 06/07/2015   LDLCALC 97 06/07/2015   Lab Results  Component Value Date   TSH 4.964 06/07/2015    Therapeutic Level Labs: No results found for: "LITHIUM" No results found for: "VALPROATE" No results found for: "CBMZ"  Current Medications: Current Outpatient Medications  Medication Sig Dispense Refill   buPROPion (WELLBUTRIN XL) 300 MG 24 hr tablet Take 1 tablet (300 mg total) by mouth daily. 30 tablet 1   sertraline (ZOLOFT) 50 MG tablet Take 3 tablets (150 mg total) by mouth daily. 90 tablet 1   No current facility-administered medications for this visit.     Musculoskeletal: Strength & Muscle Tone: within normal limits Gait & Station: normal Patient leans: N/A  Psychiatric Specialty Exam: Review of Systems  Psychiatric/Behavioral:  Positive for sleep disturbance. Negative for decreased concentration, dysphoric mood, hallucinations, self-injury and suicidal ideas. The patient is nervous/anxious. The patient is not hyperactive.     Blood pressure 121/68, pulse 71, temperature 98 F (36.7 C), temperature source Oral, height 5\' 11"  (1.803 m), weight (!) 376 lb 3.2 oz (170.6 kg), SpO2 97%.Body mass index is 52.47 kg/m.  General Appearance: Casual  Eye Contact:  Good  Speech:  Clear and Coherent and Normal Rate  Volume:  Normal  Mood:  Anxious and Depressed  Affect:  Appropriate  Thought Process:  Coherent, Goal Directed, and Descriptions of Associations: Intact  Orientation:  Full (Time, Place, and Person)  Thought Content: WDL   Suicidal Thoughts:  No  Homicidal Thoughts:  No  Memory:  Immediate;   Good Recent;   Good Remote;   Fair  Judgement:  Fair  Insight:  Fair  Psychomotor Activity:  Normal  Concentration:  Concentration: Good and Attention Span:  Good  Recall:  Good  Fund of Knowledge: Good  Language: Good  Akathisia:  No  Handed:  Right  AIMS (if indicated): not done  Assets:  Communication Skills Desire for Improvement Housing Social Support  ADL's:  Intact  Cognition: WNL  Sleep:  Fair   Screenings: GAD-7    Flowsheet Row Clinical Support from 07/30/2023 in [facility] Video Visit from 06/18/2023 in [facility] Counselor from 06/02/2023 in [facility] Clinical Support from 05/22/2023 in [facility] Clinical Support from 04/14/2023 in [facility]  Total GAD-7 Score 5 3 4 9 8       PHQ2-9    Flowsheet Row Clinical Support from 07/30/2023 in [facility] Video Visit from 06/18/2023 in [facility] Counselor from 06/02/2023 in [facility] Clinical Support from 05/22/2023 in [facility] Clinical Support from 04/14/2023 in [facility]  PHQ-2 Total Score 5 3 4 5 5   PHQ-9 Total Score 21 13 14 15 19       Flowsheet Row Clinical Support from 07/30/2023 in [facility] Video Visit from 06/18/2023 in [facility] Counselor from 06/02/2023 in [facility]  C-SSRS RISK CATEGORY No Risk No Risk No Risk        Assessment and Plan:   Kenaz Bonifay is a 27 year old, African-American male with a past psychiatric history significant for generalized anxiety disorder and major depressive disorder who presents to [facility], accompanied by his mother Jeremy Kennedy, [phone]), for follow-up and medication management.  Patient presents to the encounter stating that his medications have not been working.  He reports that he believes that he tricked  himself into thinking that his medications were working in the past but his depression remains the same.  He continues to endorse ongoing depressive symptoms but denies anxiety.  Provider recommended patient increase his dosage of Zoloft from 100 mg to 150 mg daily for the management of his depressive symptoms.  Provider also recommended increasing his Wellbutrin from 150 mg to 300 mg daily for the management of his depression.  Patient was agreeable to recommendations.  Patient's medications to be e-prescribed to pharmacy of choice.  Collaboration of Care: Collaboration of Care: Medication Management AEB provider managing patient's psychiatric medications, Psychiatrist AEB patient being followed by  mental health provider at this facility, and Referral or follow-up with counselor/therapist AEB patient to be set up with a licensed clinical social worker at this facility following the conclusion of the encounter  Patient/Guardian was advised Release of Information must be obtained prior to any record release in order to collaborate their care with an outside provider. Patient/Guardian was advised if they have not already done so to contact the registration department to sign all necessary forms in order for [place] to release information regarding their care.   Consent: Patient/Guardian gives verbal consent for treatment and assignment of benefits for services provided during this visit. Patient/Guardian expressed understanding and agreed to proceed.   1. Depression, major, single episode, severe (HCC)  - buPROPion (WELLBUTRIN XL) 300 MG 24 hr tablet; Take 1 tablet (300 mg total) by mouth daily.  Dispense: 30 tablet; Refill: 1 - sertraline (ZOLOFT) 50 MG tablet; Take 3 tablets (150 mg total) by mouth daily.  Dispense: 90 tablet; Refill: 1  2. Generalized anxiety disorder  - sertraline (ZOLOFT) 50 MG tablet; Take 3 tablets (150 mg total) by mouth daily.  Dispense: 90 tablet; Refill: 1  Patient to follow-up  in 6 weeks Provider spent a total of 25 minutes with the patient/reviewing patient's chart  Meta Hatchet, PA 08/02/2023, 2:07 PM

## 2023-08-02 ENCOUNTER — Encounter (HOSPITAL_COMMUNITY): Payer: Self-pay | Admitting: Physician Assistant

## 2023-08-07 DIAGNOSIS — L600 Ingrowing nail: Secondary | ICD-10-CM | POA: Diagnosis not present

## 2023-08-07 DIAGNOSIS — M792 Neuralgia and neuritis, unspecified: Secondary | ICD-10-CM | POA: Diagnosis not present

## 2023-08-11 DIAGNOSIS — F3341 Major depressive disorder, recurrent, in partial remission: Secondary | ICD-10-CM | POA: Diagnosis not present

## 2023-08-11 DIAGNOSIS — E7800 Pure hypercholesterolemia, unspecified: Secondary | ICD-10-CM | POA: Diagnosis not present

## 2023-08-11 DIAGNOSIS — E786 Lipoprotein deficiency: Secondary | ICD-10-CM | POA: Diagnosis not present

## 2023-08-11 DIAGNOSIS — Z1339 Encounter for screening examination for other mental health and behavioral disorders: Secondary | ICD-10-CM | POA: Diagnosis not present

## 2023-08-11 DIAGNOSIS — R7989 Other specified abnormal findings of blood chemistry: Secondary | ICD-10-CM | POA: Diagnosis not present

## 2023-08-11 DIAGNOSIS — R635 Abnormal weight gain: Secondary | ICD-10-CM | POA: Diagnosis not present

## 2023-08-11 DIAGNOSIS — E291 Testicular hypofunction: Secondary | ICD-10-CM | POA: Diagnosis not present

## 2023-08-11 DIAGNOSIS — E559 Vitamin D deficiency, unspecified: Secondary | ICD-10-CM | POA: Diagnosis not present

## 2023-08-11 DIAGNOSIS — Z6841 Body Mass Index (BMI) 40.0 and over, adult: Secondary | ICD-10-CM | POA: Diagnosis not present

## 2023-08-11 DIAGNOSIS — Z1331 Encounter for screening for depression: Secondary | ICD-10-CM | POA: Diagnosis not present

## 2023-08-18 ENCOUNTER — Encounter (HOSPITAL_COMMUNITY): Payer: Self-pay

## 2023-08-19 ENCOUNTER — Ambulatory Visit (INDEPENDENT_AMBULATORY_CARE_PROVIDER_SITE_OTHER): Payer: Medicaid Other | Admitting: Licensed Clinical Social Worker

## 2023-08-19 DIAGNOSIS — F322 Major depressive disorder, single episode, severe without psychotic features: Secondary | ICD-10-CM | POA: Diagnosis not present

## 2023-08-19 DIAGNOSIS — F411 Generalized anxiety disorder: Secondary | ICD-10-CM

## 2023-08-19 NOTE — Progress Notes (Signed)
THERAPIST PROGRESS NOTE  Session Time: 76   Participation Level: Active  Behavioral Response: CasualAlertAnxious  Type of Therapy: Individual Therapy  Treatment Goals addressed:  Active     Anxiety     LTG: Nicki will score less than 5 on the Generalized Anxiety Disorder 7 Scale (GAD-7)  (Progressing)     Start:  06/02/23    Expected End:  10/30/23         Identify 3 triggers for anxiety      Start:  06/02/23    Expected End:  10/30/23       Goal Note     Being over weight         Identify 3 coping skills for anxiety.  (Progressing)     Start:  06/02/23    Expected End:  10/30/23       Goal Note     Meditation         Encourage Zalyn to take psychotropic medication(s) as prescribed     Start:  06/02/23         Discuss risks and benefits of medication treatment options for this problem and prescribe as indicated (Completed)     Start:  06/02/23    End:  08/19/23      Review results of GAD-7 with Azavion to track progress (Completed)     Start:  06/02/23    End:  08/19/23      Work with Zackeriah to track symptoms, triggers, and/or skill use through a mood chart, diary card, or journal (Completed)     Start:  06/02/23    End:  08/19/23      Perform psychoeducation regarding anxiety disorders (Completed)     Start:  06/02/23    End:  08/19/23      Provide Yitzchok with educational information and reading material on anxiety, its causes, and symptoms.  (Completed)     Start:  06/02/23    End:  08/19/23      Work with patient individually to identify the major components of a recent episode of anxiety: physical symptoms, major thoughts and images, and major behaviors they experienced (Completed)     Start:  06/02/23    End:  08/19/23        OP Depression     LTG: Increase coping skills to manage depression and improve ability to perform daily activities (Progressing)     Start:  01/26/23    Expected End:  10/30/23       Goal Note      Meditation          STG: Linford will complete at least 80% of assigned homework  (Progressing)     Start:  01/26/23    Expected End:  10/30/23         LTG: Tyquarius WILL SCORE LESS THAN 10 ON THE PATIENT HEALTH QUESTIONNAIRE (PHQ-9) (Progressing)     Start:  06/02/23    Expected End:  10/30/23            Therapist will administer the PHQ-9 at weekly intervals for the next 12 weeks (Completed)     Start:  01/26/23    End:  08/19/23         ProgressTowards Goals: Progressing  Interventions: CBT, Motivational Interviewing, and Supportive  Summary: Calub Bailin is a 27 y.o. male who presents with flat and depressed mood\affect.  Patient was pleasant, cooperative, maintained good eye contact.  He engaged well in therapy session was dressed  casually.  Aayan was alert and oriented x 5.  Patient reports primary stressors as self-motivation.  Patient reports that things have been improving in this category but is still a struggle.  Patient reports doing chores such as taking out the trash, making his bed, and cleaning up the kitchen 3 to 4 days weekly.  Patient also reports engaging in meditation 7 out of 7 days/week.  Patient reports no problems with medications at this time.  He endorses symptoms for worthlessness, hopelessness, SI with no plan or intent, tension, and worry. Binyomin reports comparing himself to others.  Patient states that this is something that he struggles with and puts him into a deeper depression.  Suicidal/Homicidal: Nowithout intent/plan  Therapist Response:     Interventions/Plan: LCSW utilized reframing in session.  LCSW educated patient on coping skills for depression and anxiety.  LCSW presented patient with worksheets on both anxiety skills for depression and anxiety.  LCSW also provided patient a worksheet for "what anxiety is".  LCSW administered a PHQ-9.  LCSW administered the GAD-7.  LCSW reviewed scores with patient.  LCSW notes no change in GAD-7  score remaining at a 5.  LCSW notes a decrease from a 14 to an 11 for depression scale. Plan: Return again in 3 weeks.  Diagnosis: Depression, major, single episode, severe (HCC)  Generalized anxiety disorder  Collaboration of Care: Other None today    Patient/Guardian was advised Release of Information must be obtained prior to any record release in order to collaborate their care with an outside provider. Patient/Guardian was advised if they have not already done so to contact the registration department to sign all necessary forms in order for [place] to release information regarding their care.   Consent: Patient/Guardian gives verbal consent for treatment and assignment of benefits for services provided during this visit. Patient/Guardian expressed understanding and agreed to proceed.   [name], LCSW 08/19/2023

## 2023-08-21 DIAGNOSIS — M792 Neuralgia and neuritis, unspecified: Secondary | ICD-10-CM | POA: Diagnosis not present

## 2023-08-21 DIAGNOSIS — L600 Ingrowing nail: Secondary | ICD-10-CM | POA: Diagnosis not present

## 2023-08-25 DIAGNOSIS — Z6841 Body Mass Index (BMI) 40.0 and over, adult: Secondary | ICD-10-CM | POA: Diagnosis not present

## 2023-08-25 DIAGNOSIS — E7800 Pure hypercholesterolemia, unspecified: Secondary | ICD-10-CM | POA: Diagnosis not present

## 2023-09-07 DIAGNOSIS — H5213 Myopia, bilateral: Secondary | ICD-10-CM | POA: Diagnosis not present

## 2023-09-08 ENCOUNTER — Ambulatory Visit (INDEPENDENT_AMBULATORY_CARE_PROVIDER_SITE_OTHER): Payer: Medicaid Other | Admitting: Licensed Clinical Social Worker

## 2023-09-08 DIAGNOSIS — F411 Generalized anxiety disorder: Secondary | ICD-10-CM

## 2023-09-08 DIAGNOSIS — F322 Major depressive disorder, single episode, severe without psychotic features: Secondary | ICD-10-CM

## 2023-09-08 NOTE — Progress Notes (Unsigned)
THERAPIST PROGRESS NOTE  Session Time: 72  Participation Level: Active  Behavioral Response: CasualAlertAnxious and Depressed  Type of Therapy: Individual Therapy  Treatment Goals addressed:  Active     Anxiety     LTG: Jeremy Kennedy will score less than 5 on the Generalized Anxiety Disorder 7 Scale (GAD-7)  (Progressing)     Start:  06/02/23    Expected End:  10/30/23         Identify 3 triggers for anxiety      Start:  06/02/23    Expected End:  10/30/23       Goal Note     Being over weight         Identify 3 coping skills for anxiety.  (Progressing)     Start:  06/02/23    Expected End:  10/30/23       Goal Note     Meditation         Encourage Jeremy Kennedy to take psychotropic medication(s) as prescribed     Start:  06/02/23         Discuss risks and benefits of medication treatment options for this problem and prescribe as indicated (Completed)     Start:  06/02/23    End:  08/19/23      Review results of GAD-7 with Jeremy Kennedy to track progress (Completed)     Start:  06/02/23    End:  08/19/23      Work with Jeremy Kennedy to track symptoms, triggers, and/or skill use through a mood chart, diary card, or journal (Completed)     Start:  06/02/23    End:  08/19/23      Perform psychoeducation regarding anxiety disorders (Completed)     Start:  06/02/23    End:  08/19/23      Provide Jeremy Kennedy with educational information and reading material on anxiety, its causes, and symptoms.  (Completed)     Start:  06/02/23    End:  08/19/23      Work with patient individually to identify the major components of a recent episode of anxiety: physical symptoms, major thoughts and images, and major behaviors they experienced (Completed)     Start:  06/02/23    End:  08/19/23        OP Depression     LTG: Increase coping skills to manage depression and improve ability to perform daily activities (Progressing)     Start:  01/26/23    Expected End:  10/30/23          STG: Jeremy Kennedy will complete at least 80% of assigned homework  (Progressing)     Start:  01/26/23    Expected End:  10/30/23         LTG: Jeremy Kennedy WILL SCORE LESS THAN 10 ON THE PATIENT HEALTH QUESTIONNAIRE (PHQ-9) (Progressing)     Start:  06/02/23    Expected End:  10/30/23            Therapist will administer the PHQ-9 at weekly intervals for the next 12 weeks (Completed)     Start:  01/26/23    End:  08/19/23          ProgressTowards Goals: Progressing  Interventions: CBT and Motivational Interviewing  Summary: Jeremy Kennedy is a 27 y.o. male who presents with depressed and anxious mood\affect.  Patient was pleasant, cooperative, maintained good eye contact.  He engaged well in therapy session was dressed casually.  Jeremy Kennedy was alert and oriented x 5.  Patient presented today  with his mother.  Patient reports increase motivation for chores.  Patient states still feelings of worthlessness as he feels "I am them for more than just shortness".  LCSW supportive therapy for praise and encouragement as patient was only playing video games prior to starting sessions with LCSW.  Patient reports doing daily chores now.  Patient reports interest in getting his GED.  LCSW patient and mother discussed a plan for GED level prep courses through [facility].  Patient also reports that he would like to start reading different books.  LCSW patient and mother also discussed plan for increase reading and mother reports that she has "tons of books for him to choose from."  Suicidal/Homicidal: Nowithout intent/plan  Therapist Response:     Interventions/Plan: LCSW psycho analytic therapy for patient to discuss thoughts, feelings and emotions in session.  LCSW provided patient coping skills for anxiety and depression.  LCSW supportive therapy as stated above.  LCSW used motivational interviewing for open-ended questions, reflective listening and positive affirmations.  Plan: Return again in 3    weeks.  Diagnosis: Depression, major, single episode, severe (HCC)  Generalized anxiety disorder  Collaboration of Care: Other None   Patient/Guardian was advised Release of Information must be obtained prior to any record release in order to collaborate their care with an outside provider. Patient/Guardian was advised if they have not already done so to contact the registration department to sign all necessary forms in order for [place] to release information regarding their care.   Consent: Patient/Guardian gives verbal consent for treatment and assignment of benefits for services provided during this visit. Patient/Guardian expressed understanding and agreed to proceed.   Jeremy Cooks, LCSW 09/08/2023

## 2023-09-09 ENCOUNTER — Encounter (HOSPITAL_COMMUNITY): Payer: Self-pay

## 2023-09-15 ENCOUNTER — Encounter (HOSPITAL_COMMUNITY): Payer: Medicaid Other | Admitting: Physician Assistant

## 2023-10-07 ENCOUNTER — Ambulatory Visit (HOSPITAL_COMMUNITY): Payer: Medicaid Other | Admitting: Licensed Clinical Social Worker

## 2023-11-04 ENCOUNTER — Ambulatory Visit (HOSPITAL_COMMUNITY): Payer: Medicaid Other | Admitting: Licensed Clinical Social Worker

## 2023-12-07 ENCOUNTER — Ambulatory Visit: Payer: Medicaid Other | Admitting: Primary Care

## 2023-12-07 VITALS — BP 128/84 | HR 82 | Temp 96.9°F | Ht 71.0 in | Wt 393.0 lb

## 2023-12-07 DIAGNOSIS — R0683 Snoring: Secondary | ICD-10-CM

## 2023-12-07 NOTE — Progress Notes (Signed)
 @Patient  ID: Jeremy Kennedy, male    DOB: 1996/05/18, 28 y.o.   MRN: [id number]  No chief complaint on file.   Referring provider: Celestia Harder, NP  HPI: 28 year old male, never smoked.  Past medical history significant for depression/generalized anxiety disorder, vitamin D  deficiency, obesity.  12/07/2023 Discussed the use of AI scribe software for clinical note transcription with the patient, who gave verbal consent to proceed.  Patient presents today for sleep consult. Patient was referred by his PCP, Dr. Celestia with [facility], for evaluation due to daytime sleepiness. He has symptoms of loud snoring. The patient reports that these symptoms have been long-standing, with the fatigue being a constant struggle. There is also mention of forgetfulness and inattention, for which an ADHD evaluation is scheduled. He takes Zoloft  and Wellbutrin  for history of depression/generalized anxiety disorder.   The patient's sleep schedule is irregular, typically going to bed between 1 AM and 3 AM, and waking up in the late afternoon. He reports no difficulty falling asleep and does not wake up during the night. The patient denies taking any naps during the day.  The patient's weight has been fluctuating, but overall, it has been decreasing. He is currently on Wellbutrin  and Zoloft , and has noticed an increase in urination, which he attributes to the Wellbutrin .  Patient mother reports history of episodes where the patient has lost consciousness, the most recent being about a year and a half ago. During these episodes, the patient would fall into a deep sleep and snore loudly, but would come to when shaken. No seizure activity.   The patient denies any history of heart failure, seizures, or respiratory issues such as asthma or COPD. He does not consume alcohol and usually sleeps on his stomach.     Sleep questionnaire Symptoms-  loud snoring, daytime sleepiness   Prior sleep study- none Bedtime-  1am-3am Time to fall asleep- <5 mins  Nocturnal awakenings- none Out of bed/start of day- 2-3pm Weight changes- up and down Do you operate heavy machinery- no Do you currently wear CPAP- no Do you current wear oxygen- no Epworth- 8  Allergies  Allergen Reactions   Shellfish Allergy Nausea Only    Immunization History  Administered Date(s) Administered   DTaP 01/09/1998, 05/02/1998, 05/07/2000, 03/01/2001, 01/12/2003   HIB (PRP-OMP) 01/09/1998, 05/02/1998, 01/12/2003   HPV Quadrivalent 08/22/2010   Hepatitis A 08/22/2010   Hepatitis A, Ped/Adol-2 Dose 06/07/2015   Hepatitis B 1996[birth year], 01/09/1998, 05/07/2000   IPV 01/09/1998, 05/02/1998, 05/07/2000, 01/12/2003   Influenza,inj,Quad PF,6+ Mos 11/14/2022   Influenza-Unspecified 08/22/2010, 09/19/2010   MMR 05/07/2000, 01/12/2003   Meningococcal Conjugate 08/22/2010   Td 08/22/2010   Tdap 08/22/2010   Varicella 08/22/2010, 06/07/2015    Past Medical History:  Diagnosis Date   Obesity     Tobacco History: Social History   Tobacco Use  Smoking Status Never   Passive exposure: Never  Smokeless Tobacco Never   Counseling given: Not Answered   Outpatient Medications Prior to Visit  Medication Sig Dispense Refill   buPROPion  (WELLBUTRIN  XL) 300 MG 24 hr tablet Take 1 tablet (300 mg total) by mouth daily. 30 tablet 1   sertraline  (ZOLOFT ) 50 MG tablet Take 3 tablets (150 mg total) by mouth daily. 90 tablet 1   No facility-administered medications prior to visit.   Review of Systems  Review of Systems  Constitutional:  Positive for fatigue.  HENT: Negative.    Respiratory: Negative.    Psychiatric/Behavioral:  Positive for sleep disturbance.  Physical Exam  There were no vitals taken for this visit. Physical Exam Constitutional:      Appearance: Normal appearance. He is obese.  HENT:     Head: Normocephalic and atraumatic.     Mouth/Throat:     Mouth: Mucous membranes are moist.     Pharynx: Oropharynx  is clear.     Comments: Mallampati class II Neck:     Comments: Neck circumference 17 inches  Cardiovascular:     Rate and Rhythm: Normal rate and regular rhythm.  Pulmonary:     Effort: Pulmonary effort is normal.     Breath sounds: Normal breath sounds.  Musculoskeletal:        General: Normal range of motion.  Skin:    General: Skin is warm and dry.  Neurological:     General: No focal deficit present.     Mental Status: He is alert and oriented to person, place, and time. Mental status is at baseline.  Psychiatric:        Mood and Affect: Mood normal.        Behavior: Behavior normal.        Thought Content: Thought content normal.        Judgment: Judgment normal.      Lab Results:  CBC    Component Value Date/Time   WBC 8.1 11/14/2022 1638   WBC 6.6 06/25/2016 2224   RBC 4.64 11/14/2022 1638   RBC 5.10 06/25/2016 2224   HGB 13.9 11/14/2022 1638   HCT 41.7 11/14/2022 1638   PLT 288 11/14/2022 1638   MCV 90 11/14/2022 1638   MCH 30.0 11/14/2022 1638   MCH 31.0 06/25/2016 2224   MCHC 33.3 11/14/2022 1638   MCHC 33.8 06/25/2016 2224   RDW 13.5 11/14/2022 1638   LYMPHSABS 1.4 06/25/2016 2224   MONOABS 0.6 06/25/2016 2224   EOSABS 0.1 06/25/2016 2224   BASOSABS 0.0 06/25/2016 2224    BMET    Component Value Date/Time   NA 142 11/14/2022 1638   K 3.7 11/14/2022 1638   CL 105 11/14/2022 1638   CO2 23 11/14/2022 1638   GLUCOSE 100 (H) 11/14/2022 1638   GLUCOSE 122 (H) 10/12/2016 1922   BUN 9 11/14/2022 1638   CREATININE 1.18 11/14/2022 1638   CREATININE 1.10 06/07/2015 1551   CALCIUM 9.3 11/14/2022 1638   GFRNONAA >60 06/25/2016 2224   GFRAA >60 06/25/2016 2224    BNP No results found for: BNP  ProBNP No results found for: PROBNP  Imaging: No results found.   Assessment & Plan:   1. Loud snoring (Primary) - Home sleep test; Future     Suspected Sleep Apnea Chronic snoring and fatigue. BMI 54, neck circumference of 17. High likelihood  patient has sleep apnea. No history of heart failure, seizures, or respiratory issues. Sleep schedule is irregular. Currently on Wellbutrin  and Zoloft . -Order home sleep study to confirm diagnosis through [facility]. -Advise patient to avoid alcohol, sleep on side, and not drive when tired. -Follow-up virtually after sleep study to discuss results and potential treatment options.  ADHD Evaluation Pending appointment with psychiatry for evaluation of ADHD. Symptoms of forgetfulness and inattention noted. -Continue with scheduled psychiatry appointment for ADHD evaluation.  Obesity  Patient reports weight has been decreasing. Patient may benefit from speaking with primary care about GLP-1 medication for weight loss.     Almarie LELON Ferrari, NP 12/07/2023

## 2023-12-07 NOTE — Patient Instructions (Addendum)

## 2023-12-16 ENCOUNTER — Ambulatory Visit: Payer: Medicaid Other

## 2023-12-16 DIAGNOSIS — R0683 Snoring: Secondary | ICD-10-CM

## 2024-01-22 ENCOUNTER — Telehealth: Payer: Self-pay | Admitting: Primary Care

## 2024-01-22 ENCOUNTER — Telehealth: Payer: Medicaid Other | Admitting: Primary Care

## 2024-01-22 NOTE — Telephone Encounter (Signed)
 Spoke with patients mother, [name] will not be available for today's visit, can we please reach out to reschedule. First available/ ok to double book on a day that I am not already and please document in apt notes. Thanks

## 2024-01-26 NOTE — Telephone Encounter (Signed)
 Left message for patients mother to call and reschedule patients appointment per [facility].  Will forward message to front desk pool.

## 2024-02-12 ENCOUNTER — Telehealth: Payer: Medicaid Other | Admitting: Primary Care

## 2024-02-12 DIAGNOSIS — G4719 Other hypersomnia: Secondary | ICD-10-CM | POA: Diagnosis not present

## 2024-02-12 DIAGNOSIS — R0683 Snoring: Secondary | ICD-10-CM | POA: Diagnosis not present

## 2024-02-12 NOTE — Progress Notes (Signed)
 Virtual Visit via Video Note  I connected with Jeremy Kennedy on 02/12/24 at  3:30 PM EDT by a video enabled telemedicine application and verified that I am speaking with the correct person using two identifiers.  Location: Patient: Home Provider: Office    I discussed the limitations of evaluation and management by telemedicine and the availability of in person appointments. The patient expressed understanding and agreed to proceed.  History of Present Illness: 28 year old male, never smoked.  Past medical history significant for depression/generalized anxiety disorder, vitamin D deficiency, obesity.  Previous [facility] pulmonary encounter 12/07/2023 Discussed the use of AI scribe software for clinical note transcription with the patient, who gave verbal consent to proceed.  Patient presents today for sleep consult. Patient was referred by his PCP, Dr. Randa Evens with [facility], for evaluation due to daytime sleepiness. He has symptoms of loud snoring. The patient reports that these symptoms have been long-standing, with the fatigue being a constant struggle. There is also mention of forgetfulness and inattention, for which an ADHD evaluation is scheduled. He takes Zoloft and Wellbutrin for history of depression/generalized anxiety disorder.   The patient's sleep schedule is irregular, typically going to bed between 1 AM and 3 AM, and waking up in the late afternoon. He reports no difficulty falling asleep and does not wake up during the night. The patient denies taking any naps during the day.  The patient's weight has been fluctuating, but overall, it has been decreasing. He is currently on Wellbutrin and Zoloft, and has noticed an increase in urination, which he attributes to the Wellbutrin.  Patient mother reports history of episodes where the patient has lost consciousness, the most recent being about a year and a half ago. During these episodes, the patient would fall into a deep sleep and snore  loudly, but would come to when shaken. No seizure activity.   The patient denies any history of heart failure, seizures, or respiratory issues such as asthma or COPD. He does not consume alcohol and usually sleeps on his stomach.     Sleep questionnaire Symptoms-  loud snoring, daytime sleepiness   Prior sleep study- none Bedtime- 1am-3am Time to fall asleep- <5 mins  Nocturnal awakenings- none Out of bed/start of day- 2-3pm Weight changes- up and down Do you operate heavy machinery- no Do you currently wear CPAP- no Do you current wear oxygen- no Epworth- 8   02/12/2024- Interim hx Discussed the use of AI scribe software for clinical note transcription with the patient, who gave verbal consent to proceed.  History of Present Illness   Jeremy Kennedy is a 28 year old male who presents with daytime sleepiness and snoring. He is accompanied by his mother. He was referred by Dr. Randa Evens for evaluation of daytime sleepiness.  He has a long history of snoring and daytime sleepiness, which led to a referral for a sleep consultation. Symptoms include fatigue, forgetfulness, and inattention, and he was previously scheduled for an ADHD evaluation. His sleep schedule is irregular, typically going to bed between 1 AM and 3 AM and waking up in the late afternoon. He does not have trouble falling asleep and does not wake up during the night.  A home sleep study was conducted, showing 27 apneic events, averaging 3.5 events per hour, which does not meet the criteria for sleep apnea. The study lasted 527 minutes, with oxygen levels dropping to a minimum of 84% and averaging 92%. He experienced 600 snoring episodes, accounting for 20% of his sleep  time.  His mother reported episodes of sudden sleep attacks about a year and a half ago, with the last episode occurring then. These episodes involved falling into a deep sleep and snoring loudly, but they have not recurred recently. He has not experienced sudden  sleep attacks since last year, although there was a period of about three weeks where he felt extreme fatigue after minimal exertion, such as walking to the car from the grocery store.  He was previously on Zoloft and Wellbutrin for depression and anxiety but has been off both medication for about a month and a half, as it did not help him. He is not currently taking any medication for weight loss.  No waking up gasping or choking, and he reports sleeping on his side. He has not noticed any improvement in fatigue after discontinuing Zoloft. He continues to snore, and his weight has decreased since January. He does not drive and does not consume alcohol.      Observations/Objective:  Appears well without overt shortness of breath   Assessment and Plan:  1. Excessive daytime sleepiness (Primary) - Multiple sleep latency test; Future - Polysomnography 4 or more parameters (NPSG); Future  2. Loud snoring  Loud snoring  Home sleep study showed 3.5 apneic events per hour, below sleep apnea threshold. Mild sleep apnea unlikely cause of fatigue. Zoloft discontinuation did not improve symptoms. - Order in-lab sleep study and nap test to rule out narcolepsy. - Advise side sleeping. Patient does not drive or drink alcohol.  Excessive daytime sleepiness Narcolepsy considered due to sudden sleepiness. Off Zoloft for accurate testing. Further testing planned. - Order multi-sleep latency test following in-lab sleep study.  ADHD Scheduled for ADHD evaluation, not yet seen specialist. No medication started. - Advise follow-up with referring provider to schedule ADHD evaluation.  Weight management Weight fluctuating, overall decrease. Consulting about Wegovy with primary care. - Encourage weight loss efforts and follow-up with primary care regarding Wegovy.      Follow Up Instructions:  Follow-up Further testing planned to rule out sleep apnea and narcolepsy. - Schedule in-lab sleep study and  nap test, pending insurance approval, expected in about a month.    I discussed the assessment and treatment plan with the patient. The patient was provided an opportunity to ask questions and all were answered. The patient agreed with the plan and demonstrated an understanding of the instructions.   The patient was advised to call back or seek an in-person evaluation if the symptoms worsen or if the condition fails to improve as anticipated.  I provided 26 minutes of non-face-to-face time during this encounter.   Glenford Bayley, NP

## 2024-03-23 DIAGNOSIS — R6889 Other general symptoms and signs: Secondary | ICD-10-CM | POA: Diagnosis not present

## 2024-03-23 DIAGNOSIS — Z789 Other specified health status: Secondary | ICD-10-CM | POA: Diagnosis not present

## 2024-03-23 DIAGNOSIS — M9911 Subluxation complex (vertebral) of cervical region: Secondary | ICD-10-CM | POA: Diagnosis not present

## 2024-03-23 DIAGNOSIS — R41840 Attention and concentration deficit: Secondary | ICD-10-CM | POA: Diagnosis not present

## 2024-03-23 DIAGNOSIS — R4589 Other symptoms and signs involving emotional state: Secondary | ICD-10-CM | POA: Diagnosis not present

## 2024-04-07 DIAGNOSIS — R6889 Other general symptoms and signs: Secondary | ICD-10-CM | POA: Diagnosis not present

## 2024-04-07 DIAGNOSIS — Z713 Dietary counseling and surveillance: Secondary | ICD-10-CM | POA: Diagnosis not present

## 2024-04-07 DIAGNOSIS — R4589 Other symptoms and signs involving emotional state: Secondary | ICD-10-CM | POA: Diagnosis not present

## 2024-04-07 DIAGNOSIS — Z6841 Body Mass Index (BMI) 40.0 and over, adult: Secondary | ICD-10-CM | POA: Diagnosis not present

## 2024-04-07 DIAGNOSIS — R41840 Attention and concentration deficit: Secondary | ICD-10-CM | POA: Diagnosis not present

## 2024-04-07 DIAGNOSIS — E66813 Obesity, class 3: Secondary | ICD-10-CM | POA: Diagnosis not present

## 2024-05-03 DIAGNOSIS — F332 Major depressive disorder, recurrent severe without psychotic features: Secondary | ICD-10-CM | POA: Diagnosis not present

## 2024-05-03 DIAGNOSIS — F411 Generalized anxiety disorder: Secondary | ICD-10-CM | POA: Diagnosis not present

## 2024-05-04 ENCOUNTER — Other Ambulatory Visit (HOSPITAL_BASED_OUTPATIENT_CLINIC_OR_DEPARTMENT_OTHER): Payer: Self-pay

## 2024-05-04 MED ORDER — SEMAGLUTIDE-WEIGHT MANAGEMENT 1 MG/0.5ML ~~LOC~~ SOAJ
1.0000 mg | SUBCUTANEOUS | 2 refills | Status: DC
  Filled 2024-05-04: qty 2, 28d supply, fill #0

## 2024-05-09 ENCOUNTER — Ambulatory Visit (HOSPITAL_BASED_OUTPATIENT_CLINIC_OR_DEPARTMENT_OTHER): Attending: Primary Care | Admitting: Internal Medicine

## 2024-05-09 DIAGNOSIS — G4719 Other hypersomnia: Secondary | ICD-10-CM | POA: Diagnosis not present

## 2024-05-09 DIAGNOSIS — G4733 Obstructive sleep apnea (adult) (pediatric): Secondary | ICD-10-CM | POA: Diagnosis not present

## 2024-05-10 ENCOUNTER — Encounter: Payer: Self-pay | Admitting: *Deleted

## 2024-05-10 ENCOUNTER — Encounter (HOSPITAL_BASED_OUTPATIENT_CLINIC_OR_DEPARTMENT_OTHER): Admitting: Internal Medicine

## 2024-05-13 DIAGNOSIS — Z6841 Body Mass Index (BMI) 40.0 and over, adult: Secondary | ICD-10-CM | POA: Diagnosis not present

## 2024-05-13 DIAGNOSIS — R4589 Other symptoms and signs involving emotional state: Secondary | ICD-10-CM | POA: Diagnosis not present

## 2024-05-13 DIAGNOSIS — R6889 Other general symptoms and signs: Secondary | ICD-10-CM | POA: Diagnosis not present

## 2024-05-13 DIAGNOSIS — R41840 Attention and concentration deficit: Secondary | ICD-10-CM | POA: Diagnosis not present

## 2024-05-14 DIAGNOSIS — G4719 Other hypersomnia: Secondary | ICD-10-CM | POA: Diagnosis not present

## 2024-05-14 NOTE — Procedures (Signed)
 [facility] [address] [place], [place] [address] Tel: [phone]   Fax: [phone]  Polysomnography Interpretation  Patient Name:  Jeremy Kennedy, FALLS Study Date:  05/09/2024 Referring Physician:  Antonio Baumgarten, Np  Indications for Polysomnography The patient is a 28 year old Male who is 5' 11 and weighs 307.0 lbs. His BMI equals 43.0.  A full night polysomnogram was performed to evaluate for -Somnolence.  No Medications  No Data.   Polysomnogram Data A full night polysomnogram recorded the standard physiologic parameters including EEG, EOG, EMG, EKG, nasal and oral airflow.  Respiratory parameters of chest and abdominal movements were recorded with Respiratory Inductance Plethysmography belts.  Oxygen saturation was recorded by pulse oximetry.   Sleep Architecture The total recording time of the polysomnogram was 401.3 minutes.  The total sleep time was 352.5 minutes.  The patient spent 5.1% of total sleep time in Stage N1, 54.5% in Stage N2, 19.9% in Stages N3, and 20.6% in REM.  Sleep latency was 14.2 minutes.  REM latency was 133.0 minutes.  Sleep Efficiency was 87.8%.  Wake after Sleep Onset time was 34.5 minutes.  Respiratory Events The polysomnogram revealed a presence of 7 obstructive, - central, and - mixed apneas resulting in an Apnea index of 1.2 events per hour.  There were 57 hypopneas (>=3% desaturation and/or arousal) resulting in an Apnea\Hypopnea Index (AHI >=3% desaturation and/or arousal) of 10.9 events per hour.  There were 27 hypopneas (>=4% desaturation) resulting in an Apnea\Hypopnea Index (AHI >=4% desaturation) of 5.8 events per hour.  There were 20 Respiratory Effort Related Arousals resulting in a RERA index of 3.4 events per hour. The Respiratory Disturbance Index is 14.3 events per hour.  The snore index was 302.1 events per hour.  Mean oxygen saturation was 95.5%.  The lowest oxygen saturation during sleep was  81.0%.  Time spent <=88% oxygen saturation was 0.6 minutes (0.1%).  End Tidal CO2 during sleep ranged from - to - mmHg. End Tidal CO2 was greater than 50 mmHg for - minutes and greater than 55 mmHg for - minutes.  Limb Activity There were 24 total limb movements recorded, of this total, 18 were classified as PLMs.  PLM index was 3.1 per hour and PLM associated with Arousals index was 1.4 per hour.  Cardiac Summary The average pulse rate was 72.1 bpm.  The minimum pulse rate was 55.0 bpm while the maximum pulse rate was 106.0 bpm.  Cardiac rhythm was normal  Comment: Mild obstructive sleep apnea, AHI (3%) 10.9/hr. Snoring with oxygen desaturation to a nadir of 81%, mean 95.5%.  MSLT was not done because AHI > / = 5/hr, per protocol.  Diagnosis: Obstructive sleep apnea  Recommendations: Suggest autopap, CPAP titration study, or fitted oral appliance. Reconsider somnolence once OSA addressed.   This study was personally reviewed and electronically signed by: Dr. [employer] Accredited Board Certified in Sleep Medicine Date/Time: 05/14/24 12:41        Diagnostic PSG Report  Patient Name: Jeremy Kennedy, ILIC Study Date: 05/09/2024  Date of Birth: 09-07-1996 Study Type: Diagnostic  Age: 80 year MRN #: [id number]  Sex: Male Interpreting Physician: [employer] [id number]  Height: 5' 11 Referring Physician: Antonio Baumgarten, Np  Weight: 307.0 lbs Recording Tech: Charlsie Cool CRT RPSGT RST  BMI: 43.0 Scoring Tech: Charlsie Cool CRT RPSGT RST  ESS: 10 Neck Size: 18.5   Study Overview  Lights Off: 10:20:58 PM  Count Index  Lights On: 05:02:15 AM  Awakenings: 27 4.6  Time in Bed: 401.3 min. Arousals: 72 12.3  Total Sleep Time: 352.5 min. AHI (>=3% Desat and/or Ar.): 64 10.9   Sleep Efficiency: 87.8% AHI (>=4% Desat): 34 5.8   Sleep Latency: 14.2 min. Limb Movements: 24 4.1  Wake After Sleep Onset: 34.5 min. Snore: 1775 302.1  REM Latency from Sleep Onset: 133.0 min. Desaturations:  93 15.8     Minimum SpO2 TST: 81.0%    Sleep Architecture  % of Time in Bed Stages Time (mins) % Sleep Time  Wake 49.5   Stage N1 18.0 5.1%  Stage N2 192.0 54.5%  Stage N3 70.0 19.9%  REM 72.5 20.6%   Arousal Summary   NREM REM Sleep Index  Respiratory Arousals 21 5 26  4.4  PLM Arousals 7 1 8  1.4  Isolated Limb Movement Arousals 3 - 3 0.5  Snore Arousals 3 1 4  0.7  Spontaneous Arousals 24 7 31  5.3  Total 58 14 72 12.3   Limb Movement Summary   Count Index  Isolated Limb Movements 6 1.0  Periodic Limb Movements (PLMs) 18 3.1  Total Limb Movements 24 4.1    Respiratory Summary   By Sleep Stage By Body Position Total   NREM REM Supine Non-Supine   Time (min) 280.0 72.5 60.0 292.5 352.5         Obstructive Apnea 4 3 7  - 7  Mixed Apnea - - - - -  Central Apnea - - - - -  Total Apneas 4 3 7  - 7  Total Apnea Index 0.9 2.5 7.0 - 1.2         Hypopneas (>=3% Desat and/or Ar.) 32 25 25 32 57  AHI (>=3% Desat and/or Ar.) 7.7 23.2 32.0 6.6 10.9         Hypopneas (>=4% Desat) 15 12 17 10 27   AHI (>=4% Desat) 4.1 12.4 24.0 2.1 5.8          RERAs 17 3 8 12 20   RERA Index 3.6 2.5 8.0 2.5 3.4         RDI 11.4 25.7 40.0 9.0 14.3    Respiratory Event Type Index  Central Apneas -  Obstructive Apneas 1.2  Mixed Apneas -  Central Hypopneas -  Obstructive Hypopneas -  Central Apnea + Hypopnea (CAHI) -  Obstructive Apnea + Hypopnea (OAHI) 10.9   Respiratory Event Durations   Apnea Hypopnea   NREM REM NREM REM  Average (seconds) 17.1 14.7 23.0 21.7  Maximum (seconds) 21.9 20.7 43.7 43.2    Oxygen Saturation Summary   Wake NREM REM TST TIB  Average SpO2 (%) 96.0% 95.3% 96.1% 95.5% 95.5%  Minimum SpO2 (%) 89.0% 81.0% 92.0% 81.0% 81.0%  Maximum SpO2 (%) 99.0% 98.0% 99.0% 99.0% 99.0%   Oxygen Saturation Distribution  Range (%) Time in range (min) Time in range (%)  90.0 - 100.0 399.5 99.6%  80.0 - 90.0 1.6 0.4%  70.0 - 80.0 - -  60.0 - 70.0 - -  50.0 - 60.0 - -   0.0 - 50.0 - -  Time Spent <=88% SpO2  Range (%) Time in range (min) Time in range (%)  0.0 - 88.0 0.6 0.1%      Count Index  Desaturations 93 15.8    Cardiac Summary   Wake NREM REM Sleep Total  Average Pulse Rate (BPM) 84.7 68.7 76.8 70.4 72.1  Minimum Pulse Rate (BPM) 61.0 55.0 56.0 55.0 55.0  Maximum Pulse Rate (BPM) 106.0 100.0 97.0  100.0 106.0   Pulse Rate Distribution:  Range (bpm) Time in range (min) Time in range (%)  0.0 - 40.0 - -  40.0 - 60.0 9.3 2.3%  60.0 - 80.0 324.1 80.8%  80.0 - 100.0 66.8 16.6%  100.0 - 120.0 0.3 0.1%  120.0 - 140.0 - -  140.0 - 200.0 - -   EtCO2 Summary  Stage Min (mmHg) Average (mmHg) Max (mmHg)  Wake - - -  NREM(1+2+3) - - -  REM - - -   EtCO2 Distribution:  Range (mmHg) Time in range (min) Time in range (%)  20.0 - 40.0 - -  40.0 - 50.0 - -  50.0 - 100.0 - -  55.0 - 100.0 - -  Excluded data <20.0 & >65.0 402.0 100.0%     Hypnograms                         Technologist Comments  THE 76-YEAR-OLD MALE PATIENT PRESENTED TO THE SLEEP DISORDER CENTER FOR A NPSG DIAGNOSTIC STUDY WITH A CHIEF COMPLAINT OF EXCESSIVE DAYTIME SLEEPINESS. NO BEDTIME MEDICATIONS WERE SELF ADMINISTERED. THE LEAD PLACEMENT WAS INITIATED, THEN THE STUDY WAS BEGUN. SUPPLEMENTAL OXYGEN WAS NOT WARRANTED DURING THE STUDY. MILD TO MODERATE INTERMITTENT SNORING WAS NOTED THROUGHOUT THE STUDY. NO PLMs - PLMAs WERE NOTED. NO OBVIOUS CARDIAC ARRHYTHMIAS WERE OBSERVED. NO RESTROOM VISIT WAS MADE. NO OBVIOUS PARASOMNIAS WERE OBSERVED. THE PATIENT TOLERATED THE NPSG DIAGNOSTIC STUDY WELL.                           [employer] Diplomate, Biomedical engineer of Sleep Medicine  ELECTRONICALLY SIGNED ON:  05/14/2024, 12:36 PM [facility] PH: [phone]   FX: [phone] ACCREDITED BY THE [employer] OF SLEEP MEDICINE

## 2024-06-06 DIAGNOSIS — F332 Major depressive disorder, recurrent severe without psychotic features: Secondary | ICD-10-CM | POA: Diagnosis not present

## 2024-06-06 DIAGNOSIS — F411 Generalized anxiety disorder: Secondary | ICD-10-CM | POA: Diagnosis not present

## 2024-06-07 ENCOUNTER — Other Ambulatory Visit (HOSPITAL_BASED_OUTPATIENT_CLINIC_OR_DEPARTMENT_OTHER): Payer: Self-pay

## 2024-06-07 MED ORDER — WEGOVY 1 MG/0.5ML ~~LOC~~ SOAJ
1.0000 mg | SUBCUTANEOUS | 2 refills | Status: AC
  Filled 2024-06-07: qty 2, 28d supply, fill #0
  Filled 2024-09-27: qty 2, 28d supply, fill #1

## 2024-06-13 ENCOUNTER — Other Ambulatory Visit (HOSPITAL_BASED_OUTPATIENT_CLINIC_OR_DEPARTMENT_OTHER): Payer: Self-pay

## 2024-06-14 ENCOUNTER — Other Ambulatory Visit: Payer: Self-pay

## 2024-06-17 ENCOUNTER — Encounter: Payer: Self-pay | Admitting: Advanced Practice Midwife

## 2024-06-20 DIAGNOSIS — F900 Attention-deficit hyperactivity disorder, predominantly inattentive type: Secondary | ICD-10-CM | POA: Diagnosis not present

## 2024-06-21 DIAGNOSIS — F900 Attention-deficit hyperactivity disorder, predominantly inattentive type: Secondary | ICD-10-CM | POA: Diagnosis not present

## 2024-06-29 ENCOUNTER — Telehealth: Payer: Self-pay | Admitting: Primary Care

## 2024-06-29 NOTE — Telephone Encounter (Signed)
 Please let patient now sleep study showed mild obstructive sleep apnea, AHI (3%) 10.9/hr. Snoring with oxygen desaturation to a nadir of 81%, mean 95.5%.  MSLT was not done because AHI > / = 5/hr, per protocol.   Diagnosis: Obstructive sleep apnea   Recommendations: Suggest autopap, CPAP titration study, or fitted oral appliance. Reconsider somnolence once OSA addressed.  Needs follow-up to address treatment options

## 2024-06-30 NOTE — Telephone Encounter (Signed)
 I tried calling pt. Pt's mother picked up but I could not find in [place]. I advised pt's mother to have pt call our office back. Pt's mother verbalized understanding.

## 2024-07-05 NOTE — Telephone Encounter (Signed)
 Attempted to call patient again, LVM.

## 2024-07-05 NOTE — Telephone Encounter (Signed)
Can we follow-up on this?

## 2024-07-08 DIAGNOSIS — F411 Generalized anxiety disorder: Secondary | ICD-10-CM | POA: Diagnosis not present

## 2024-07-08 DIAGNOSIS — F332 Major depressive disorder, recurrent severe without psychotic features: Secondary | ICD-10-CM | POA: Diagnosis not present

## 2024-08-10 ENCOUNTER — Other Ambulatory Visit (HOSPITAL_BASED_OUTPATIENT_CLINIC_OR_DEPARTMENT_OTHER): Payer: Self-pay

## 2024-08-10 MED ORDER — BUPROPION HCL ER (XL) 300 MG PO TB24
300.0000 mg | ORAL_TABLET | Freq: Every morning | ORAL | 2 refills | Status: AC
  Filled 2024-09-27: qty 30, 30d supply, fill #0

## 2024-08-10 MED ORDER — BUPROPION HCL ER (XL) 300 MG PO TB24
300.0000 mg | ORAL_TABLET | Freq: Every morning | ORAL | 1 refills | Status: AC
  Filled 2024-09-27: qty 90, 90d supply, fill #0

## 2024-08-10 MED ORDER — BUPROPION HCL ER (XL) 450 MG PO TB24
450.0000 mg | ORAL_TABLET | Freq: Every morning | ORAL | 1 refills | Status: AC
  Filled 2024-09-27: qty 90, 90d supply, fill #0

## 2024-08-10 MED ORDER — SEMAGLUTIDE-WEIGHT MANAGEMENT 0.5 MG/0.5ML ~~LOC~~ SOAJ
0.5000 mg | SUBCUTANEOUS | 0 refills | Status: AC
  Filled 2024-08-10: qty 2, 28d supply, fill #0

## 2024-08-10 MED ORDER — SEMAGLUTIDE-WEIGHT MANAGEMENT 1.7 MG/0.75ML ~~LOC~~ SOAJ
1.7000 mg | SUBCUTANEOUS | 3 refills | Status: AC
  Filled 2024-08-10: qty 9, 84d supply, fill #0
  Filled 2024-12-22: qty 3, 28d supply, fill #1

## 2024-08-11 ENCOUNTER — Other Ambulatory Visit (HOSPITAL_BASED_OUTPATIENT_CLINIC_OR_DEPARTMENT_OTHER): Payer: Self-pay

## 2024-08-16 ENCOUNTER — Other Ambulatory Visit (HOSPITAL_BASED_OUTPATIENT_CLINIC_OR_DEPARTMENT_OTHER): Payer: Self-pay

## 2024-09-27 ENCOUNTER — Other Ambulatory Visit: Payer: Self-pay

## 2024-09-28 ENCOUNTER — Other Ambulatory Visit (HOSPITAL_BASED_OUTPATIENT_CLINIC_OR_DEPARTMENT_OTHER): Payer: Self-pay

## 2024-10-03 ENCOUNTER — Other Ambulatory Visit (HOSPITAL_BASED_OUTPATIENT_CLINIC_OR_DEPARTMENT_OTHER): Payer: Self-pay

## 2024-10-05 ENCOUNTER — Other Ambulatory Visit (HOSPITAL_BASED_OUTPATIENT_CLINIC_OR_DEPARTMENT_OTHER): Payer: Self-pay

## 2024-10-08 ENCOUNTER — Other Ambulatory Visit (HOSPITAL_BASED_OUTPATIENT_CLINIC_OR_DEPARTMENT_OTHER): Payer: Self-pay

## 2024-11-18 DIAGNOSIS — Z789 Other specified health status: Secondary | ICD-10-CM | POA: Diagnosis not present

## 2024-11-18 DIAGNOSIS — F332 Major depressive disorder, recurrent severe without psychotic features: Secondary | ICD-10-CM | POA: Diagnosis not present

## 2024-12-22 ENCOUNTER — Other Ambulatory Visit: Payer: Self-pay

## 2024-12-23 ENCOUNTER — Other Ambulatory Visit (HOSPITAL_BASED_OUTPATIENT_CLINIC_OR_DEPARTMENT_OTHER): Payer: Self-pay
